# Patient Record
Sex: Female | Born: 1950 | Race: White | Hispanic: No | Smoking: Never smoker
Health system: Southern US, Community
[De-identification: ages and names within clinical notes are randomized; demographics above are authoritative.]

## PROBLEM LIST (undated history)

## (undated) DIAGNOSIS — J301 Allergic rhinitis due to pollen: Secondary | ICD-10-CM

## (undated) DIAGNOSIS — K219 Gastro-esophageal reflux disease without esophagitis: Secondary | ICD-10-CM

## (undated) DIAGNOSIS — J45909 Unspecified asthma, uncomplicated: Secondary | ICD-10-CM

## (undated) HISTORY — PX: OTHER SURGICAL HISTORY: SHX169

## (undated) HISTORY — DX: Allergic rhinitis due to pollen: J30.1

## (undated) HISTORY — DX: Unspecified asthma, uncomplicated: J45.909

## (undated) HISTORY — DX: Gastro-esophageal reflux disease without esophagitis: K21.9

## (undated) HISTORY — PX: BREAST EXCISIONAL BIOPSY: SUR124

---

## 2003-11-03 ENCOUNTER — Encounter: Admission: RE | Admit: 2003-11-03 | Discharge: 2003-11-03 | Payer: Self-pay | Admitting: Obstetrics and Gynecology

## 2004-05-01 ENCOUNTER — Encounter: Admission: RE | Admit: 2004-05-01 | Discharge: 2004-05-01 | Payer: Self-pay | Admitting: Obstetrics and Gynecology

## 2004-10-03 ENCOUNTER — Encounter: Admission: RE | Admit: 2004-10-03 | Discharge: 2004-10-03 | Payer: Self-pay | Admitting: Obstetrics and Gynecology

## 2005-10-23 ENCOUNTER — Encounter: Admission: RE | Admit: 2005-10-23 | Discharge: 2005-10-23 | Payer: Self-pay | Admitting: Obstetrics and Gynecology

## 2006-11-21 ENCOUNTER — Encounter: Admission: RE | Admit: 2006-11-21 | Discharge: 2006-11-21 | Payer: Self-pay | Admitting: Internal Medicine

## 2007-11-28 ENCOUNTER — Encounter: Admission: RE | Admit: 2007-11-28 | Discharge: 2007-11-28 | Payer: Self-pay | Admitting: Obstetrics and Gynecology

## 2008-12-07 ENCOUNTER — Encounter: Admission: RE | Admit: 2008-12-07 | Discharge: 2008-12-07 | Payer: Self-pay | Admitting: Internal Medicine

## 2009-12-28 ENCOUNTER — Encounter: Admission: RE | Admit: 2009-12-28 | Discharge: 2009-12-28 | Payer: Self-pay | Admitting: Obstetrics and Gynecology

## 2011-01-02 ENCOUNTER — Other Ambulatory Visit: Payer: Self-pay | Admitting: Obstetrics and Gynecology

## 2011-01-02 DIAGNOSIS — Z1231 Encounter for screening mammogram for malignant neoplasm of breast: Secondary | ICD-10-CM

## 2011-01-19 ENCOUNTER — Ambulatory Visit
Admission: RE | Admit: 2011-01-19 | Discharge: 2011-01-19 | Disposition: A | Discharge status: Home / Self Care | Payer: BC Managed Care – PPO | Source: Ambulatory Visit | Attending: Obstetrics and Gynecology | Admitting: Obstetrics and Gynecology

## 2011-01-19 DIAGNOSIS — Z1231 Encounter for screening mammogram for malignant neoplasm of breast: Secondary | ICD-10-CM

## 2012-01-16 ENCOUNTER — Other Ambulatory Visit: Payer: Self-pay | Admitting: Unknown Physician Specialty

## 2012-01-16 DIAGNOSIS — Z1231 Encounter for screening mammogram for malignant neoplasm of breast: Secondary | ICD-10-CM

## 2012-01-25 ENCOUNTER — Ambulatory Visit
Admission: RE | Admit: 2012-01-25 | Discharge: 2012-01-25 | Disposition: A | Discharge status: Home / Self Care | Payer: BC Managed Care – PPO | Source: Ambulatory Visit | Attending: Unknown Physician Specialty | Admitting: Unknown Physician Specialty

## 2012-01-25 DIAGNOSIS — Z1231 Encounter for screening mammogram for malignant neoplasm of breast: Secondary | ICD-10-CM

## 2013-02-03 ENCOUNTER — Other Ambulatory Visit: Payer: Self-pay

## 2013-02-03 DIAGNOSIS — Z1231 Encounter for screening mammogram for malignant neoplasm of breast: Secondary | ICD-10-CM

## 2013-02-27 ENCOUNTER — Ambulatory Visit
Admission: RE | Admit: 2013-02-27 | Discharge: 2013-02-27 | Disposition: A | Discharge status: Home / Self Care | Payer: BC Managed Care – PPO | Source: Ambulatory Visit

## 2013-02-27 DIAGNOSIS — Z1231 Encounter for screening mammogram for malignant neoplasm of breast: Secondary | ICD-10-CM

## 2013-03-03 ENCOUNTER — Other Ambulatory Visit: Payer: Self-pay | Admitting: Unknown Physician Specialty

## 2013-03-03 DIAGNOSIS — R928 Other abnormal and inconclusive findings on diagnostic imaging of breast: Secondary | ICD-10-CM

## 2013-03-17 ENCOUNTER — Ambulatory Visit
Admission: RE | Admit: 2013-03-17 | Discharge: 2013-03-17 | Disposition: A | Discharge status: Home / Self Care | Payer: BC Managed Care – PPO | Source: Ambulatory Visit | Attending: Unknown Physician Specialty | Admitting: Unknown Physician Specialty

## 2013-03-17 DIAGNOSIS — R928 Other abnormal and inconclusive findings on diagnostic imaging of breast: Secondary | ICD-10-CM

## 2014-02-15 ENCOUNTER — Other Ambulatory Visit: Payer: Self-pay

## 2014-02-15 DIAGNOSIS — Z1231 Encounter for screening mammogram for malignant neoplasm of breast: Secondary | ICD-10-CM

## 2014-03-05 ENCOUNTER — Ambulatory Visit
Admission: RE | Admit: 2014-03-05 | Discharge: 2014-03-05 | Disposition: A | Discharge status: Home / Self Care | Payer: BC Managed Care – PPO | Source: Ambulatory Visit

## 2014-03-05 DIAGNOSIS — Z1231 Encounter for screening mammogram for malignant neoplasm of breast: Secondary | ICD-10-CM

## 2015-02-04 ENCOUNTER — Other Ambulatory Visit: Payer: Self-pay

## 2015-02-04 DIAGNOSIS — Z1231 Encounter for screening mammogram for malignant neoplasm of breast: Secondary | ICD-10-CM

## 2015-03-11 ENCOUNTER — Ambulatory Visit
Admission: RE | Admit: 2015-03-11 | Discharge: 2015-03-11 | Disposition: A | Discharge status: Home / Self Care | Payer: Medicare Other | Source: Ambulatory Visit

## 2015-03-11 DIAGNOSIS — Z1231 Encounter for screening mammogram for malignant neoplasm of breast: Secondary | ICD-10-CM

## 2015-03-18 ENCOUNTER — Ambulatory Visit: Payer: Self-pay

## 2015-06-29 DIAGNOSIS — H25013 Cortical age-related cataract, bilateral: Secondary | ICD-10-CM | POA: Diagnosis not present

## 2015-06-29 DIAGNOSIS — H401131 Primary open-angle glaucoma, bilateral, mild stage: Secondary | ICD-10-CM | POA: Diagnosis not present

## 2015-07-04 DIAGNOSIS — Z1283 Encounter for screening for malignant neoplasm of skin: Secondary | ICD-10-CM | POA: Diagnosis not present

## 2015-07-04 DIAGNOSIS — L579 Skin changes due to chronic exposure to nonionizing radiation, unspecified: Secondary | ICD-10-CM | POA: Diagnosis not present

## 2015-07-04 DIAGNOSIS — L820 Inflamed seborrheic keratosis: Secondary | ICD-10-CM | POA: Diagnosis not present

## 2015-07-25 DIAGNOSIS — Z7189 Other specified counseling: Secondary | ICD-10-CM | POA: Diagnosis not present

## 2015-07-25 DIAGNOSIS — Z79899 Other long term (current) drug therapy: Secondary | ICD-10-CM | POA: Diagnosis not present

## 2015-07-25 DIAGNOSIS — Z0000 Encounter for general adult medical examination without abnormal findings: Secondary | ICD-10-CM | POA: Diagnosis not present

## 2015-07-25 DIAGNOSIS — Z1389 Encounter for screening for other disorder: Secondary | ICD-10-CM | POA: Diagnosis not present

## 2015-07-25 DIAGNOSIS — R5383 Other fatigue: Secondary | ICD-10-CM | POA: Diagnosis not present

## 2015-08-18 DIAGNOSIS — M810 Age-related osteoporosis without current pathological fracture: Secondary | ICD-10-CM | POA: Diagnosis not present

## 2015-09-02 DIAGNOSIS — N390 Urinary tract infection, site not specified: Secondary | ICD-10-CM | POA: Diagnosis not present

## 2015-10-14 DIAGNOSIS — K5900 Constipation, unspecified: Secondary | ICD-10-CM | POA: Diagnosis not present

## 2015-10-14 DIAGNOSIS — Z299 Encounter for prophylactic measures, unspecified: Secondary | ICD-10-CM | POA: Diagnosis not present

## 2015-10-18 ENCOUNTER — Encounter (INDEPENDENT_AMBULATORY_CARE_PROVIDER_SITE_OTHER): Payer: Self-pay | Admitting: *Deleted

## 2015-11-09 ENCOUNTER — Encounter (INDEPENDENT_AMBULATORY_CARE_PROVIDER_SITE_OTHER): Payer: Self-pay

## 2015-11-09 ENCOUNTER — Other Ambulatory Visit (INDEPENDENT_AMBULATORY_CARE_PROVIDER_SITE_OTHER): Payer: Self-pay | Admitting: *Deleted

## 2015-11-09 ENCOUNTER — Encounter (INDEPENDENT_AMBULATORY_CARE_PROVIDER_SITE_OTHER): Payer: Self-pay | Admitting: *Deleted

## 2015-11-09 ENCOUNTER — Ambulatory Visit (INDEPENDENT_AMBULATORY_CARE_PROVIDER_SITE_OTHER): Payer: Medicare Other | Admitting: Internal Medicine

## 2015-11-09 ENCOUNTER — Encounter (INDEPENDENT_AMBULATORY_CARE_PROVIDER_SITE_OTHER): Payer: Self-pay | Admitting: Internal Medicine

## 2015-11-09 ENCOUNTER — Other Ambulatory Visit (INDEPENDENT_AMBULATORY_CARE_PROVIDER_SITE_OTHER): Payer: Self-pay | Admitting: Internal Medicine

## 2015-11-09 VITALS — BP 110/68 | HR 66 | Temp 97.7°F | Ht 67.0 in | Wt 164.5 lb

## 2015-11-09 DIAGNOSIS — K5909 Other constipation: Secondary | ICD-10-CM

## 2015-11-09 DIAGNOSIS — Z1211 Encounter for screening for malignant neoplasm of colon: Secondary | ICD-10-CM

## 2015-11-09 DIAGNOSIS — J45909 Unspecified asthma, uncomplicated: Secondary | ICD-10-CM | POA: Insufficient documentation

## 2015-11-09 MED ORDER — PEG 3350-KCL-NA BICARB-NACL 420 G PO SOLR: 4000.0000 mL | Freq: Once | ORAL | Status: DC

## 2015-11-09 NOTE — Telephone Encounter (Signed)
Patient needs trilyte 

## 2015-11-09 NOTE — Progress Notes (Signed)
   Subjective:    Patient ID: Mackenzie Craig, female    DOB: May 10, 1951, 65 y.o.   MRN: [id number]  HPI Referred by Dr. Woody Seller for  colonoscopy.  She tells me she is doing okay. Her appetite is good. She eats lots of vegetables and water. No dysphagia. She says sometimes her stools are thin. She has not seen any blood. She usually has a BM or every 3 days. No abdominal pain.  She denies any acid reflux unless she eats spicy foods.  No change in her stool.  Last colonoscopy was in 2005 by Dr. Lindalou Hose. She tells me there were no polyps. Some diverticulosis in her ascending colon.  Colonoscope passed all the way to the cecum. Internal Hemorrhoids.   07/26/2015 H and H 13.1 and 40.0, MCV 89.  Review of Systems Past Medical History  Diagnosis Date  . Asthma     Past Surgical History  Procedure Laterality Date  . Breast biopsies      x 2 and benign    No Known Allergies  No current outpatient prescriptions on file prior to visit.   No current facility-administered medications on file prior to visit.   Current Outpatient Prescriptions  Medication Sig Dispense Refill  . Biotin 1000 MCG tablet Take 1,000 mcg by mouth 3 (three) times daily.    . fexofenadine (ALLEGRA) 180 MG tablet Take 180 mg by mouth daily.     No current facility-administered medications for this visit.         Objective:   Physical ExamBlood pressure 110/68, pulse 66, temperature 97.7 F (36.5 C), height 5\' 7"  (1.702 m), weight 164 lb 8 oz (74.617 kg). Alert and oriented. Skin warm and dry. Oral mucosa is moist.   . Sclera anicteric, conjunctivae is pink. Thyroid not enlarged. No cervical lymphadenopathy. Lungs clear. Heart regular rate and rhythm.  Abdomen is soft. Bowel sounds are positive. No hepatomegaly. No abdominal masses felt. No tenderness.  No edema to lower extremities.          Assessment & Plan:  Constipation: Benefiber daily.  Screening colonoscopy. The risks and benefits such as perforation,  bleeding, and infection were reviewed with the patient and is agreeable.

## 2015-11-09 NOTE — Patient Instructions (Addendum)
The risks and benefits such as perforation, bleeding, and infection were reviewed with the patient and is agreeable. Benefiber for constipation.

## 2015-11-22 DIAGNOSIS — R0602 Shortness of breath: Secondary | ICD-10-CM | POA: Diagnosis not present

## 2015-11-28 DIAGNOSIS — J45909 Unspecified asthma, uncomplicated: Secondary | ICD-10-CM | POA: Diagnosis not present

## 2015-11-28 DIAGNOSIS — J301 Allergic rhinitis due to pollen: Secondary | ICD-10-CM | POA: Diagnosis not present

## 2016-01-11 ENCOUNTER — Ambulatory Visit (HOSPITAL_COMMUNITY)
Admission: RE | Admit: 2016-01-11 | Discharge: 2016-01-11 | Disposition: A | Discharge status: Home / Self Care | Payer: Medicare Other | Source: Ambulatory Visit | Attending: Internal Medicine | Admitting: Internal Medicine

## 2016-01-11 ENCOUNTER — Encounter (HOSPITAL_COMMUNITY): Payer: Self-pay | Admitting: *Deleted

## 2016-01-11 ENCOUNTER — Encounter (HOSPITAL_COMMUNITY)
Admission: RE | Disposition: A | Discharge status: Home / Self Care | Payer: Self-pay | Source: Ambulatory Visit | Attending: Internal Medicine

## 2016-01-11 DIAGNOSIS — D123 Benign neoplasm of transverse colon: Secondary | ICD-10-CM | POA: Diagnosis not present

## 2016-01-11 DIAGNOSIS — K5730 Diverticulosis of large intestine without perforation or abscess without bleeding: Secondary | ICD-10-CM | POA: Diagnosis not present

## 2016-01-11 DIAGNOSIS — K5909 Other constipation: Secondary | ICD-10-CM

## 2016-01-11 DIAGNOSIS — K644 Residual hemorrhoidal skin tags: Secondary | ICD-10-CM | POA: Diagnosis not present

## 2016-01-11 DIAGNOSIS — Z79899 Other long term (current) drug therapy: Secondary | ICD-10-CM | POA: Insufficient documentation

## 2016-01-11 DIAGNOSIS — J45909 Unspecified asthma, uncomplicated: Secondary | ICD-10-CM | POA: Diagnosis not present

## 2016-01-11 DIAGNOSIS — Z1211 Encounter for screening for malignant neoplasm of colon: Secondary | ICD-10-CM | POA: Diagnosis not present

## 2016-01-11 HISTORY — PX: COLONOSCOPY: SHX5424

## 2016-01-11 SURGERY — COLONOSCOPY
Anesthesia: Moderate Sedation

## 2016-01-11 MED ORDER — MEPERIDINE HCL 50 MG/ML IJ SOLN
INTRAMUSCULAR | Status: DC
Start: 2016-01-11 — End: 2016-01-11
  Filled 2016-01-11: qty 1

## 2016-01-11 MED ORDER — MIDAZOLAM HCL 5 MG/5ML IJ SOLN
INTRAMUSCULAR | Status: AC
  Filled 2016-01-11: qty 10

## 2016-01-11 MED ORDER — SODIUM CHLORIDE 0.9 % IV SOLN
INTRAVENOUS | Status: DC
Start: 2016-01-11 — End: 2016-01-11
  Administered 2016-01-11: 11:00:00 via INTRAVENOUS

## 2016-01-11 MED ORDER — MIDAZOLAM HCL 5 MG/5ML IJ SOLN
INTRAMUSCULAR | Status: DC | PRN
  Administered 2016-01-11 (×3): 2 mg via INTRAVENOUS

## 2016-01-11 MED ORDER — STERILE WATER FOR IRRIGATION IR SOLN
Status: DC | PRN
  Administered 2016-01-11: 12:00:00

## 2016-01-11 MED ORDER — MEPERIDINE HCL 50 MG/ML IJ SOLN
INTRAMUSCULAR | Status: DC | PRN
Start: 2016-01-11 — End: 2016-01-11
  Administered 2016-01-11 (×2): 25 mg

## 2016-01-11 NOTE — Op Note (Signed)
[facility] Patient Name: Mackenzie Craig Procedure Date: 01/11/2016 11:23 AM MRN: [id number] Date of Birth: 05/21/1951 Attending MD: Hildred Laser , MD CSN: [id number] Age: 65 Admit Type: Outpatient Procedure:                Colonoscopy Indications:              Screening for colorectal malignant neoplasm Providers:                Hildred Laser, MD, Janeece Riggers, RN, Randa Spike,                            Technician Referring MD:             Glenda Chroman, MD Medicines:                Meperidine 50 mg IV, Midazolam 6 mg IV Complications:            No immediate complications. Estimated Blood Loss:     Estimated blood loss was minimal. Procedure:                Pre-Anesthesia Assessment:                           - Prior to the procedure, a History and Physical                            was performed, and patient medications and                            allergies were reviewed. The patient's tolerance of                            previous anesthesia was also reviewed. The risks                            and benefits of the procedure and the sedation                            options and risks were discussed with the patient.                            All questions were answered, and informed consent                            was obtained. Prior Anticoagulants: The patient has                            taken no previous anticoagulant or antiplatelet                            agents. ASA Grade Assessment: I - A normal, healthy                            patient. After reviewing the risks and benefits,  the patient was deemed in satisfactory condition to                            undergo the procedure.                           After obtaining informed consent, the colonoscope                            was passed under direct vision. Throughout the                            procedure, the patient's blood pressure, pulse, and         oxygen saturations were monitored continuously. The                            [id number]) scope was introduced through                            the anus and advanced to the the cecum, identified                            by appendiceal orifice and ileocecal valve. The                            colonoscopy was performed without difficulty. The                            patient tolerated the procedure well. The quality                            of the bowel preparation was good. The ileocecal                            valve, appendiceal orifice, and rectum were                            photographed. Scope In: 11:41:06 AM Scope Out: 12:00:53 PM Scope Withdrawal Time: 0 hours 11 minutes 55 seconds  Total Procedure Duration: 0 hours 19 minutes 47 seconds  Findings:      A 5 mm polyp was found in the transverse colon. The polyp was sessile.       The polyp was removed with a cold snare. Resection and retrieval were       complete.      A few small-mouthed diverticula were found in the sigmoid colon.      External hemorrhoids were found during retroflexion. The hemorrhoids       were small. Impression:               - One 5 mm polyp in the transverse colon, removed                            with a cold snare. Resected and retrieved.                           -  Diverticulosis in the sigmoid colon.                           - External hemorrhoids. Moderate Sedation:      Moderate (conscious) sedation was administered by the endoscopy nurse       and supervised by the endoscopist. The following parameters were       monitored: oxygen saturation, heart rate, blood pressure, CO2       capnography and response to care. Total physician intraservice time was       26 minutes. Recommendation:           - Patient has a contact number available for                            emergencies. The signs and symptoms of potential                            delayed complications were  discussed with the                            patient. Return to normal activities tomorrow.                            Written discharge instructions were provided to the                            patient.                           - High fiber diet today.                           - Continue present medications.                           - Await pathology results.                           - Repeat colonoscopy for surveillance based on                            pathology results. Procedure Code(s):        --- Professional ---                           [phone], Colonoscopy, flexible; with removal of                            tumor(s), polyp(s), or other lesion(s) by snare                            technique                           99152, Moderate sedation services provided by the  same physician or other qualified health care                            professional performing the diagnostic or                            therapeutic service that the sedation supports,                            requiring the presence of an independent trained                            observer to assist in the monitoring of the                            patient's level of consciousness and physiological                            status; initial 15 minutes of intraservice time,                            patient age 12 years or older                           [phone], Moderate sedation services; each additional                            15 minutes intraservice time Diagnosis Code(s):        --- Professional ---                           Z12.11, Encounter for screening for malignant                            neoplasm of colon                           D12.3, Benign neoplasm of transverse colon (hepatic                            flexure or splenic flexure)                           K64.4, Residual hemorrhoidal skin tags                           K57.30, Diverticulosis of  large intestine without                            perforation or abscess without bleeding CPT copyright 2016 American [employer] Association. All rights reserved. The codes documented in this report are preliminary and upon coder review may  be revised to meet current compliance requirements. Hildred Laser, MD Hildred Laser, MD 01/11/2016 12:07:16 PM This report has been signed electronically. Number of Addenda: 0

## 2016-01-11 NOTE — H&P (Signed)
Mackenzie Craig is an 65 y.o. female.   Chief Complaint: Patient is here for colonoscopy. HPI: Patient is 65 year old Caucasian female who is here for screening colonoscopy. She denies abdominal pain change in bowel habits or rectal bleeding. Last exam was in 2005. Family history is negative for CRC.  Past Medical History:  Diagnosis Date  . Asthma     Past Surgical History:  Procedure Laterality Date  . breast biopsies     x 2 and benign    History reviewed. No pertinent family history. Social History:  reports that she has never smoked. She has never used smokeless tobacco. She reports that she does not drink alcohol or use drugs.  Allergies: No Known Allergies  Medications Prior to Admission  Medication Sig Dispense Refill  . Biotin 1000 MCG tablet Take 1,000 mcg by mouth 3 (three) times daily.    . fexofenadine (ALLEGRA) 180 MG tablet Take 180 mg by mouth daily.    . Multiple Vitamin (MULTIVITAMIN WITH MINERALS) TABS tablet Take 1 tablet by mouth daily.    . polyethylene glycol-electrolytes (NULYTELY/GOLYTELY) 420 g solution Take 4,000 mLs by mouth once. 4000 mL 0  . PROAIR HFA 108 (90 Base) MCG/ACT inhaler Inhale 2 puffs into the lungs every 4 (four) hours as needed for wheezing or shortness of breath.   3  . tretinoin (RETIN-A) 0.1 % cream Apply 1 application topically at bedtime as needed (ACNE).   99    No results found for this or any previous visit (from the past 48 hour(s)). No results found.  ROS  Blood pressure 134/72, pulse 69, temperature 98.4 F (36.9 C), temperature source Oral, resp. rate 14, height 5\' 7"  (1.702 m), weight 158 lb (71.7 kg), SpO2 100 %. Physical Exam  Constitutional: She appears well-developed and well-nourished.  HENT:  Mouth/Throat: Oropharynx is clear and moist.  Eyes: Conjunctivae are normal. No scleral icterus.  Neck: No thyromegaly present.  Cardiovascular: Normal rate, regular rhythm and normal heart sounds.   No murmur  heard. Respiratory: Effort normal and breath sounds normal.  GI: Soft. She exhibits no distension and no mass. There is no tenderness.  Musculoskeletal: She exhibits no edema.  Lymphadenopathy:    She has no cervical adenopathy.  Neurological: She is alert.  Skin: Skin is warm.     Assessment/Plan Average risk screening colonoscopy.  Hildred Laser, MD 01/11/2016, 11:31 AM

## 2016-01-11 NOTE — Discharge Instructions (Signed)
Resume usual medications and high fiber diet. No driving for 24 hours. Physician will call with biopsy results.  Colonoscopy, Care After Refer to this sheet in the next few weeks. These instructions provide you with information on caring for yourself after your procedure. Your health care provider may also give you more specific instructions. Your treatment has been planned according to current medical practices, but problems sometimes occur. Call your health care provider if you have any problems or questions after your procedure. WHAT TO EXPECT AFTER THE PROCEDURE  After your procedure, it is typical to have the following:  A small amount of blood in your stool.  Moderate amounts of gas and mild abdominal cramping or bloating. HOME CARE INSTRUCTIONS  Do not drive, operate machinery, or sign important documents for 24 hours.  You may shower and resume your regular physical activities, but move at a slower pace for the first 24 hours.  Take frequent rest periods for the first 24 hours.  Walk around or put a warm pack on your abdomen to help reduce abdominal cramping and bloating.  Drink enough fluids to keep your urine clear or pale yellow.  You may resume your normal diet as instructed by your health care provider. Avoid heavy or fried foods that are hard to digest.  Avoid drinking alcohol for 24 hours or as instructed by your health care provider.  Only take over-the-counter or prescription medicines as directed by your health care provider.  If a tissue sample (biopsy) was taken during your procedure:  Do not take aspirin or blood thinners for 7 days, or as instructed by your health care provider.  Do not drink alcohol for 7 days, or as instructed by your health care provider.  Eat soft foods for the first 24 hours. SEEK MEDICAL CARE IF: You have persistent spotting of blood in your stool 2-3 days after the procedure. SEEK IMMEDIATE MEDICAL CARE IF:  You have more than a  small spotting of blood in your stool.  You pass large blood clots in your stool.  Your abdomen is swollen (distended).  You have nausea or vomiting.  You have a fever.  You have increasing abdominal pain that is not relieved with medicine.   This information is not intended to replace advice given to you by your health care provider. Make sure you discuss any questions you have with your health care provider.   Colon Polyps Polyps are lumps of extra tissue growing inside the body. Polyps can grow in the large intestine (colon). Most colon polyps are noncancerous (benign). However, some colon polyps can become cancerous over time. Polyps that are larger than a pea may be harmful. To be safe, caregivers remove and test all polyps. CAUSES  Polyps form when mutations in the genes cause your cells to grow and divide even though no more tissue is needed. RISK FACTORS There are a number of risk factors that can increase your chances of getting colon polyps. They include:  Being older than 50 years.  Family history of colon polyps or colon cancer.  Long-term colon diseases, such as colitis or Crohn disease.  Being overweight.  Smoking.  Being inactive.  Drinking too much alcohol. SYMPTOMS  Most small polyps do not cause symptoms. If symptoms are present, they may include:  Blood in the stool. The stool may look dark red or black.  Constipation or diarrhea that lasts longer than 1 week. DIAGNOSIS People often do not know they have polyps until their caregiver  finds them during a regular checkup. Your caregiver can use 4 tests to check for polyps:  Digital rectal exam. The caregiver wears gloves and feels inside the rectum. This test would find polyps only in the rectum.  Barium enema. The caregiver puts a liquid called barium into your rectum before taking X-rays of your colon. Barium makes your colon look white. Polyps are dark, so they are easy to see in the X-ray  pictures.  Sigmoidoscopy. A thin, flexible tube (sigmoidoscope) is placed into your rectum. The sigmoidoscope has a light and tiny camera in it. The caregiver uses the sigmoidoscope to look at the last third of your colon.  Colonoscopy. This test is like sigmoidoscopy, but the caregiver looks at the entire colon. This is the most common method for finding and removing polyps. TREATMENT  Any polyps will be removed during a sigmoidoscopy or colonoscopy. The polyps are then tested for cancer. PREVENTION  To help lower your risk of getting more colon polyps:  Eat plenty of fruits and vegetables. Avoid eating fatty foods.  Do not smoke.  Avoid drinking alcohol.  Exercise every day.  Lose weight if recommended by your caregiver.  Eat plenty of calcium and folate. Foods that are rich in calcium include milk, cheese, and broccoli. Foods that are rich in folate include chickpeas, kidney beans, and spinach. HOME CARE INSTRUCTIONS Keep all follow-up appointments as directed by your caregiver. You may need periodic exams to check for polyps. SEEK MEDICAL CARE IF: You notice bleeding during a bowel movement.   This information is not intended to replace advice given to you by your health care provider. Make sure you discuss any questions you have with your health care provider.    Diverticulosis Diverticulosis is the condition that develops when small pouches (diverticula) form in the wall of your colon. Your colon, or large intestine, is where water is absorbed and stool is formed. The pouches form when the inside layer of your colon pushes through weak spots in the outer layers of your colon. CAUSES  No one knows exactly what causes diverticulosis. RISK FACTORS  Being older than 40. Your risk for this condition increases with age. Diverticulosis is rare in people younger than 40 years. By age 88, almost everyone has it.  Eating a low-fiber diet.  Being frequently constipated.  Being  overweight.  Not getting enough exercise.  Smoking.  Taking over-the-counter pain medicines, like aspirin and ibuprofen. SYMPTOMS  Most people with diverticulosis do not have symptoms. DIAGNOSIS  Because diverticulosis often has no symptoms, health care providers often discover the condition during an exam for other colon problems. In many cases, a health care provider will diagnose diverticulosis while using a flexible scope to examine the colon (colonoscopy). TREATMENT  If you have never developed an infection related to diverticulosis, you may not need treatment. If you have had an infection before, treatment may include:  Eating more fruits, vegetables, and grains.  Taking a fiber supplement.  Taking a live bacteria supplement (probiotic).  Taking medicine to relax your colon. HOME CARE INSTRUCTIONS   Drink at least 6-8 glasses of water each day to prevent constipation.  Try not to strain when you have a bowel movement.  Keep all follow-up appointments. If you have had an infection before:  Increase the fiber in your diet as directed by your health care provider or dietitian.  Take a dietary fiber supplement if your health care provider approves.  Only take medicines as directed by your  health care provider. SEEK MEDICAL CARE IF:   You have abdominal pain.  You have bloating.  You have cramps.  You have not gone to the bathroom in 3 days. SEEK IMMEDIATE MEDICAL CARE IF:   Your pain gets worse.  Yourbloating becomes very bad.  You have a fever or chills, and your symptoms suddenly get worse.  You begin vomiting.  You have bowel movements that are bloody or black. MAKE SURE YOU:  Understand these instructions.  Will watch your condition.  Will get help right away if you are not doing well or get worse.   This information is not intended to replace advice given to you by your health care provider. Make sure you discuss any questions you have with your  health care provider.

## 2016-01-13 ENCOUNTER — Encounter (HOSPITAL_COMMUNITY): Payer: Self-pay | Admitting: Internal Medicine

## 2016-01-16 DIAGNOSIS — H401131 Primary open-angle glaucoma, bilateral, mild stage: Secondary | ICD-10-CM | POA: Diagnosis not present

## 2016-01-23 DIAGNOSIS — H401131 Primary open-angle glaucoma, bilateral, mild stage: Secondary | ICD-10-CM | POA: Diagnosis not present

## 2016-01-25 DIAGNOSIS — H401131 Primary open-angle glaucoma, bilateral, mild stage: Secondary | ICD-10-CM | POA: Diagnosis not present

## 2016-01-25 DIAGNOSIS — H2513 Age-related nuclear cataract, bilateral: Secondary | ICD-10-CM | POA: Diagnosis not present

## 2016-02-27 DIAGNOSIS — H401131 Primary open-angle glaucoma, bilateral, mild stage: Secondary | ICD-10-CM | POA: Diagnosis not present

## 2016-03-12 ENCOUNTER — Encounter (HOSPITAL_COMMUNITY)
Admission: RE | Disposition: A | Discharge status: Home / Self Care | Payer: Self-pay | Source: Ambulatory Visit | Attending: Ophthalmology

## 2016-03-12 ENCOUNTER — Ambulatory Visit (HOSPITAL_COMMUNITY)
Admission: RE | Admit: 2016-03-12 | Discharge: 2016-03-12 | Disposition: A | Discharge status: Home / Self Care | Payer: Medicare Other | Source: Ambulatory Visit | Attending: Ophthalmology | Admitting: Ophthalmology

## 2016-03-12 ENCOUNTER — Encounter (HOSPITAL_COMMUNITY): Payer: Self-pay | Admitting: *Deleted

## 2016-03-12 DIAGNOSIS — H409 Unspecified glaucoma: Secondary | ICD-10-CM | POA: Diagnosis not present

## 2016-03-12 DIAGNOSIS — H401131 Primary open-angle glaucoma, bilateral, mild stage: Secondary | ICD-10-CM | POA: Diagnosis not present

## 2016-03-12 HISTORY — PX: SLT LASER APPLICATION: SHX6099

## 2016-03-12 SURGERY — SLT LASER APPLICATION
Anesthesia: LOCAL | Laterality: Left

## 2016-03-12 MED ORDER — APRACLONIDINE HCL 1 % OP SOLN
1.0000 [drp] | OPHTHALMIC | Status: AC
  Administered 2016-03-12 (×3): 1 [drp] via OPHTHALMIC

## 2016-03-12 MED ORDER — PILOCARPINE HCL 1 % OP SOLN
2.0000 [drp] | Freq: Once | OPHTHALMIC | Status: AC
  Administered 2016-03-12: 2 [drp] via OPHTHALMIC

## 2016-03-12 MED ORDER — TETRACAINE HCL 0.5 % OP SOLN
OPHTHALMIC | Status: AC
  Filled 2016-03-12: qty 4

## 2016-03-12 MED ORDER — APRACLONIDINE HCL 1 % OP SOLN
OPHTHALMIC | Status: AC
  Filled 2016-03-12: qty 0.1

## 2016-03-12 MED ORDER — TETRACAINE HCL 0.5 % OP SOLN
1.0000 [drp] | Freq: Once | OPHTHALMIC | Status: AC
  Administered 2016-03-12: 1 [drp] via OPHTHALMIC

## 2016-03-12 MED ORDER — PILOCARPINE HCL 1 % OP SOLN
OPHTHALMIC | Status: AC
  Filled 2016-03-12: qty 15

## 2016-03-12 NOTE — Discharge Instructions (Signed)
QUINIYAH FRITZSCHE  03/12/2016     Instructions    Activity: No Restrictions.   Diet: Resume Diet you were on at home.   Pain Medication: Tylenol if Needed.   CONTACT YOUR DOCTOR IF YOU HAVE PAIN, REDNESS IN YOUR EYE, OR DECREASED VISION.   Follow-up: 04/03/16 at 11:15am with Williams Che, MD.     Dr. Iona Hansen: [phone]     If you find that you cannot contact your physician, but feel that your signs and   Symptoms warrant a physician's attention, call the Emergency Room at   [phone] [phone].

## 2016-03-12 NOTE — H&P (Signed)
I have reviewed the pre printed H&P, the patient was re-examined, and I have identified no significant interval changes in the patient's medical condition.  There is no change in the plan of care since the history and physical of record. 

## 2016-03-12 NOTE — Brief Op Note (Signed)
MARCILLA STABER 03/12/2016  Williams Che, MD  Pre-op Diagnosis:  uncontrolled glaucoma  OS  Post-op Diagnosis: uncontrolled glaucoma  OS  Yag laser self-test completed: Yes.    Indications:  See scanned office H&P for details  Procedure: SLT laser  left eye  Eye protection worn by staff:  Yes.   Laser In Use sign on door:  Yes.    Laser:  {LUMENIS YAG/SLT LASER  Power Setting:  0.7-0.9 mJ/burst Anatomical site treated:  Trabecular meshwork OS Number of applications:  0000000 Total energy delivered: 100.81 mJ Results:  Treated to bubble formation  The patient was discharged home with instructions to continue all her current glaucoma medications in the un-operated eye, and discontinue all her current glaucoma medications, if any.  Patient was instructed to go to the office, as previously scheduled, for intraocular pressure:  Yes.    Patient verbalizes understanding of discharge instructions:  Yes.    Notes:  Gomoscopy:  Grade:   4 Open    Pigmentation:  Heavy pigmentation    Synechiae:  none    Angle ressessions : none    No complications, pt tolerated procedure well

## 2016-03-16 DIAGNOSIS — M8580 Other specified disorders of bone density and structure, unspecified site: Secondary | ICD-10-CM | POA: Diagnosis not present

## 2016-03-16 DIAGNOSIS — Z299 Encounter for prophylactic measures, unspecified: Secondary | ICD-10-CM | POA: Diagnosis not present

## 2016-03-16 DIAGNOSIS — J45909 Unspecified asthma, uncomplicated: Secondary | ICD-10-CM | POA: Diagnosis not present

## 2016-03-16 DIAGNOSIS — R42 Dizziness and giddiness: Secondary | ICD-10-CM | POA: Diagnosis not present

## 2016-03-19 ENCOUNTER — Encounter (HOSPITAL_COMMUNITY): Payer: Self-pay | Admitting: Ophthalmology

## 2016-04-05 ENCOUNTER — Other Ambulatory Visit: Payer: Self-pay | Admitting: Unknown Physician Specialty

## 2016-04-05 DIAGNOSIS — Z1231 Encounter for screening mammogram for malignant neoplasm of breast: Secondary | ICD-10-CM

## 2016-04-06 DIAGNOSIS — Z23 Encounter for immunization: Secondary | ICD-10-CM | POA: Diagnosis not present

## 2016-05-01 ENCOUNTER — Ambulatory Visit
Admission: RE | Admit: 2016-05-01 | Discharge: 2016-05-01 | Disposition: A | Discharge status: Home / Self Care | Payer: Medicare Other | Source: Ambulatory Visit | Attending: Unknown Physician Specialty | Admitting: Unknown Physician Specialty

## 2016-05-01 DIAGNOSIS — Z1231 Encounter for screening mammogram for malignant neoplasm of breast: Secondary | ICD-10-CM

## 2016-05-14 DIAGNOSIS — H401132 Primary open-angle glaucoma, bilateral, moderate stage: Secondary | ICD-10-CM | POA: Diagnosis not present

## 2016-05-14 DIAGNOSIS — H43812 Vitreous degeneration, left eye: Secondary | ICD-10-CM | POA: Diagnosis not present

## 2016-05-14 DIAGNOSIS — H2513 Age-related nuclear cataract, bilateral: Secondary | ICD-10-CM | POA: Diagnosis not present

## 2016-05-30 DIAGNOSIS — Z23 Encounter for immunization: Secondary | ICD-10-CM | POA: Diagnosis not present

## 2016-06-20 DIAGNOSIS — H2513 Age-related nuclear cataract, bilateral: Secondary | ICD-10-CM | POA: Diagnosis not present

## 2016-06-20 DIAGNOSIS — H401132 Primary open-angle glaucoma, bilateral, moderate stage: Secondary | ICD-10-CM | POA: Diagnosis not present

## 2016-06-20 DIAGNOSIS — H401122 Primary open-angle glaucoma, left eye, moderate stage: Secondary | ICD-10-CM | POA: Diagnosis not present

## 2016-06-20 DIAGNOSIS — H43812 Vitreous degeneration, left eye: Secondary | ICD-10-CM | POA: Diagnosis not present

## 2016-07-19 DIAGNOSIS — Z1283 Encounter for screening for malignant neoplasm of skin: Secondary | ICD-10-CM | POA: Diagnosis not present

## 2016-07-19 DIAGNOSIS — L908 Other atrophic disorders of skin: Secondary | ICD-10-CM | POA: Diagnosis not present

## 2016-07-19 DIAGNOSIS — D225 Melanocytic nevi of trunk: Secondary | ICD-10-CM | POA: Diagnosis not present

## 2016-07-30 DIAGNOSIS — Z1389 Encounter for screening for other disorder: Secondary | ICD-10-CM | POA: Diagnosis not present

## 2016-07-30 DIAGNOSIS — Z0000 Encounter for general adult medical examination without abnormal findings: Secondary | ICD-10-CM | POA: Diagnosis not present

## 2016-07-30 DIAGNOSIS — Z1211 Encounter for screening for malignant neoplasm of colon: Secondary | ICD-10-CM | POA: Diagnosis not present

## 2016-07-30 DIAGNOSIS — Z79899 Other long term (current) drug therapy: Secondary | ICD-10-CM | POA: Diagnosis not present

## 2016-07-30 DIAGNOSIS — E7800 Pure hypercholesterolemia, unspecified: Secondary | ICD-10-CM | POA: Diagnosis not present

## 2016-07-30 DIAGNOSIS — Z7189 Other specified counseling: Secondary | ICD-10-CM | POA: Diagnosis not present

## 2016-07-30 DIAGNOSIS — E663 Overweight: Secondary | ICD-10-CM | POA: Diagnosis not present

## 2016-07-30 DIAGNOSIS — Z299 Encounter for prophylactic measures, unspecified: Secondary | ICD-10-CM | POA: Diagnosis not present

## 2016-07-30 DIAGNOSIS — J45909 Unspecified asthma, uncomplicated: Secondary | ICD-10-CM | POA: Diagnosis not present

## 2016-07-30 DIAGNOSIS — R5383 Other fatigue: Secondary | ICD-10-CM | POA: Diagnosis not present

## 2016-10-22 DIAGNOSIS — H401132 Primary open-angle glaucoma, bilateral, moderate stage: Secondary | ICD-10-CM | POA: Diagnosis not present

## 2016-10-22 DIAGNOSIS — H2513 Age-related nuclear cataract, bilateral: Secondary | ICD-10-CM | POA: Diagnosis not present

## 2016-10-22 DIAGNOSIS — H401122 Primary open-angle glaucoma, left eye, moderate stage: Secondary | ICD-10-CM | POA: Diagnosis not present

## 2016-10-22 DIAGNOSIS — H43812 Vitreous degeneration, left eye: Secondary | ICD-10-CM | POA: Diagnosis not present

## 2016-11-19 DIAGNOSIS — J45909 Unspecified asthma, uncomplicated: Secondary | ICD-10-CM | POA: Diagnosis not present

## 2016-11-19 DIAGNOSIS — J301 Allergic rhinitis due to pollen: Secondary | ICD-10-CM | POA: Diagnosis not present

## 2017-01-23 DIAGNOSIS — Z299 Encounter for prophylactic measures, unspecified: Secondary | ICD-10-CM | POA: Diagnosis not present

## 2017-01-23 DIAGNOSIS — R21 Rash and other nonspecific skin eruption: Secondary | ICD-10-CM | POA: Diagnosis not present

## 2017-01-23 DIAGNOSIS — J45909 Unspecified asthma, uncomplicated: Secondary | ICD-10-CM | POA: Diagnosis not present

## 2017-01-23 DIAGNOSIS — F419 Anxiety disorder, unspecified: Secondary | ICD-10-CM | POA: Diagnosis not present

## 2017-01-23 DIAGNOSIS — Z789 Other specified health status: Secondary | ICD-10-CM | POA: Diagnosis not present

## 2017-01-23 DIAGNOSIS — Z6829 Body mass index (BMI) 29.0-29.9, adult: Secondary | ICD-10-CM | POA: Diagnosis not present

## 2017-01-23 DIAGNOSIS — E7800 Pure hypercholesterolemia, unspecified: Secondary | ICD-10-CM | POA: Diagnosis not present

## 2017-02-25 DIAGNOSIS — H2513 Age-related nuclear cataract, bilateral: Secondary | ICD-10-CM | POA: Diagnosis not present

## 2017-02-25 DIAGNOSIS — H401132 Primary open-angle glaucoma, bilateral, moderate stage: Secondary | ICD-10-CM | POA: Diagnosis not present

## 2017-02-25 DIAGNOSIS — H43813 Vitreous degeneration, bilateral: Secondary | ICD-10-CM | POA: Diagnosis not present

## 2017-04-01 DIAGNOSIS — Z23 Encounter for immunization: Secondary | ICD-10-CM | POA: Diagnosis not present

## 2017-04-17 ENCOUNTER — Other Ambulatory Visit: Payer: Self-pay | Admitting: Internal Medicine

## 2017-04-17 DIAGNOSIS — Z1231 Encounter for screening mammogram for malignant neoplasm of breast: Secondary | ICD-10-CM

## 2017-05-16 ENCOUNTER — Ambulatory Visit
Admission: RE | Admit: 2017-05-16 | Discharge: 2017-05-16 | Disposition: A | Discharge status: Home / Self Care | Payer: Medicare Other | Source: Ambulatory Visit | Attending: Internal Medicine | Admitting: Internal Medicine

## 2017-05-16 DIAGNOSIS — Z1231 Encounter for screening mammogram for malignant neoplasm of breast: Secondary | ICD-10-CM

## 2017-07-03 DIAGNOSIS — H401132 Primary open-angle glaucoma, bilateral, moderate stage: Secondary | ICD-10-CM | POA: Diagnosis not present

## 2017-07-03 DIAGNOSIS — H2513 Age-related nuclear cataract, bilateral: Secondary | ICD-10-CM | POA: Diagnosis not present

## 2017-07-03 DIAGNOSIS — H43813 Vitreous degeneration, bilateral: Secondary | ICD-10-CM | POA: Diagnosis not present

## 2017-07-31 DIAGNOSIS — J4521 Mild intermittent asthma with (acute) exacerbation: Secondary | ICD-10-CM | POA: Diagnosis not present

## 2017-07-31 DIAGNOSIS — R05 Cough: Secondary | ICD-10-CM | POA: Diagnosis not present

## 2017-08-05 DIAGNOSIS — M8580 Other specified disorders of bone density and structure, unspecified site: Secondary | ICD-10-CM | POA: Diagnosis not present

## 2017-08-05 DIAGNOSIS — Z6823 Body mass index (BMI) 23.0-23.9, adult: Secondary | ICD-10-CM | POA: Diagnosis not present

## 2017-08-05 DIAGNOSIS — Z7189 Other specified counseling: Secondary | ICD-10-CM | POA: Diagnosis not present

## 2017-08-05 DIAGNOSIS — Z1339 Encounter for screening examination for other mental health and behavioral disorders: Secondary | ICD-10-CM | POA: Diagnosis not present

## 2017-08-05 DIAGNOSIS — Z1331 Encounter for screening for depression: Secondary | ICD-10-CM | POA: Diagnosis not present

## 2017-08-05 DIAGNOSIS — Z79899 Other long term (current) drug therapy: Secondary | ICD-10-CM | POA: Diagnosis not present

## 2017-08-05 DIAGNOSIS — Z1211 Encounter for screening for malignant neoplasm of colon: Secondary | ICD-10-CM | POA: Diagnosis not present

## 2017-08-05 DIAGNOSIS — Z0000 Encounter for general adult medical examination without abnormal findings: Secondary | ICD-10-CM | POA: Diagnosis not present

## 2017-08-05 DIAGNOSIS — Z299 Encounter for prophylactic measures, unspecified: Secondary | ICD-10-CM | POA: Diagnosis not present

## 2017-08-26 DIAGNOSIS — Z01419 Encounter for gynecological examination (general) (routine) without abnormal findings: Secondary | ICD-10-CM | POA: Diagnosis not present

## 2017-08-26 DIAGNOSIS — Z124 Encounter for screening for malignant neoplasm of cervix: Secondary | ICD-10-CM | POA: Diagnosis not present

## 2017-09-03 DIAGNOSIS — E2839 Other primary ovarian failure: Secondary | ICD-10-CM | POA: Diagnosis not present

## 2017-11-01 DIAGNOSIS — H43813 Vitreous degeneration, bilateral: Secondary | ICD-10-CM | POA: Diagnosis not present

## 2017-11-01 DIAGNOSIS — H2513 Age-related nuclear cataract, bilateral: Secondary | ICD-10-CM | POA: Diagnosis not present

## 2017-11-01 DIAGNOSIS — H401132 Primary open-angle glaucoma, bilateral, moderate stage: Secondary | ICD-10-CM | POA: Diagnosis not present

## 2017-12-13 DIAGNOSIS — J45909 Unspecified asthma, uncomplicated: Secondary | ICD-10-CM | POA: Diagnosis not present

## 2017-12-13 DIAGNOSIS — J301 Allergic rhinitis due to pollen: Secondary | ICD-10-CM | POA: Diagnosis not present

## 2018-01-01 DIAGNOSIS — L908 Other atrophic disorders of skin: Secondary | ICD-10-CM | POA: Diagnosis not present

## 2018-01-01 DIAGNOSIS — L578 Other skin changes due to chronic exposure to nonionizing radiation: Secondary | ICD-10-CM | POA: Diagnosis not present

## 2018-03-04 DIAGNOSIS — H2513 Age-related nuclear cataract, bilateral: Secondary | ICD-10-CM | POA: Diagnosis not present

## 2018-03-04 DIAGNOSIS — H43813 Vitreous degeneration, bilateral: Secondary | ICD-10-CM | POA: Diagnosis not present

## 2018-03-04 DIAGNOSIS — H401132 Primary open-angle glaucoma, bilateral, moderate stage: Secondary | ICD-10-CM | POA: Diagnosis not present

## 2018-04-07 DIAGNOSIS — Z23 Encounter for immunization: Secondary | ICD-10-CM | POA: Diagnosis not present

## 2018-04-10 ENCOUNTER — Other Ambulatory Visit: Payer: Self-pay | Admitting: Internal Medicine

## 2018-04-10 DIAGNOSIS — Z1231 Encounter for screening mammogram for malignant neoplasm of breast: Secondary | ICD-10-CM

## 2018-05-23 ENCOUNTER — Ambulatory Visit
Admission: RE | Admit: 2018-05-23 | Discharge: 2018-05-23 | Disposition: A | Discharge status: Home / Self Care | Payer: Medicare Other | Source: Ambulatory Visit | Attending: Internal Medicine | Admitting: Internal Medicine

## 2018-05-23 DIAGNOSIS — Z1231 Encounter for screening mammogram for malignant neoplasm of breast: Secondary | ICD-10-CM | POA: Diagnosis not present

## 2018-06-06 DIAGNOSIS — Z23 Encounter for immunization: Secondary | ICD-10-CM | POA: Diagnosis not present

## 2018-07-01 DIAGNOSIS — R05 Cough: Secondary | ICD-10-CM | POA: Diagnosis not present

## 2018-07-01 DIAGNOSIS — J4521 Mild intermittent asthma with (acute) exacerbation: Secondary | ICD-10-CM | POA: Diagnosis not present

## 2018-07-01 DIAGNOSIS — J209 Acute bronchitis, unspecified: Secondary | ICD-10-CM | POA: Diagnosis not present

## 2018-07-08 DIAGNOSIS — H43813 Vitreous degeneration, bilateral: Secondary | ICD-10-CM | POA: Diagnosis not present

## 2018-07-08 DIAGNOSIS — H401132 Primary open-angle glaucoma, bilateral, moderate stage: Secondary | ICD-10-CM | POA: Diagnosis not present

## 2018-07-08 DIAGNOSIS — H2513 Age-related nuclear cataract, bilateral: Secondary | ICD-10-CM | POA: Diagnosis not present

## 2018-07-28 DIAGNOSIS — H401112 Primary open-angle glaucoma, right eye, moderate stage: Secondary | ICD-10-CM | POA: Diagnosis not present

## 2018-08-08 DIAGNOSIS — Z79899 Other long term (current) drug therapy: Secondary | ICD-10-CM | POA: Diagnosis not present

## 2018-08-08 DIAGNOSIS — F419 Anxiety disorder, unspecified: Secondary | ICD-10-CM | POA: Diagnosis not present

## 2018-08-08 DIAGNOSIS — R5383 Other fatigue: Secondary | ICD-10-CM | POA: Diagnosis not present

## 2018-08-08 DIAGNOSIS — Z1211 Encounter for screening for malignant neoplasm of colon: Secondary | ICD-10-CM | POA: Diagnosis not present

## 2018-08-08 DIAGNOSIS — Z0000 Encounter for general adult medical examination without abnormal findings: Secondary | ICD-10-CM | POA: Diagnosis not present

## 2018-08-08 DIAGNOSIS — Z299 Encounter for prophylactic measures, unspecified: Secondary | ICD-10-CM | POA: Diagnosis not present

## 2018-08-08 DIAGNOSIS — Z7189 Other specified counseling: Secondary | ICD-10-CM | POA: Diagnosis not present

## 2018-08-08 DIAGNOSIS — E7800 Pure hypercholesterolemia, unspecified: Secondary | ICD-10-CM | POA: Diagnosis not present

## 2018-08-08 DIAGNOSIS — Z1339 Encounter for screening examination for other mental health and behavioral disorders: Secondary | ICD-10-CM | POA: Diagnosis not present

## 2018-08-08 DIAGNOSIS — Z1331 Encounter for screening for depression: Secondary | ICD-10-CM | POA: Diagnosis not present

## 2018-08-08 DIAGNOSIS — Z6822 Body mass index (BMI) 22.0-22.9, adult: Secondary | ICD-10-CM | POA: Diagnosis not present

## 2018-09-15 DIAGNOSIS — H2513 Age-related nuclear cataract, bilateral: Secondary | ICD-10-CM | POA: Diagnosis not present

## 2018-09-15 DIAGNOSIS — H401132 Primary open-angle glaucoma, bilateral, moderate stage: Secondary | ICD-10-CM | POA: Diagnosis not present

## 2018-11-13 DIAGNOSIS — Z01818 Encounter for other preprocedural examination: Secondary | ICD-10-CM | POA: Diagnosis not present

## 2018-11-13 DIAGNOSIS — Z1159 Encounter for screening for other viral diseases: Secondary | ICD-10-CM | POA: Diagnosis not present

## 2018-11-18 DIAGNOSIS — J45909 Unspecified asthma, uncomplicated: Secondary | ICD-10-CM | POA: Diagnosis not present

## 2018-11-18 DIAGNOSIS — R0602 Shortness of breath: Secondary | ICD-10-CM | POA: Diagnosis not present

## 2018-11-24 DIAGNOSIS — H2513 Age-related nuclear cataract, bilateral: Secondary | ICD-10-CM | POA: Diagnosis not present

## 2018-11-24 DIAGNOSIS — H43813 Vitreous degeneration, bilateral: Secondary | ICD-10-CM | POA: Diagnosis not present

## 2018-11-24 DIAGNOSIS — H401132 Primary open-angle glaucoma, bilateral, moderate stage: Secondary | ICD-10-CM | POA: Diagnosis not present

## 2018-12-02 DIAGNOSIS — Z6823 Body mass index (BMI) 23.0-23.9, adult: Secondary | ICD-10-CM | POA: Diagnosis not present

## 2018-12-02 DIAGNOSIS — N390 Urinary tract infection, site not specified: Secondary | ICD-10-CM | POA: Diagnosis not present

## 2018-12-02 DIAGNOSIS — Z299 Encounter for prophylactic measures, unspecified: Secondary | ICD-10-CM | POA: Diagnosis not present

## 2018-12-02 DIAGNOSIS — R350 Frequency of micturition: Secondary | ICD-10-CM | POA: Diagnosis not present

## 2018-12-02 DIAGNOSIS — Z87891 Personal history of nicotine dependence: Secondary | ICD-10-CM | POA: Diagnosis not present

## 2019-01-02 DIAGNOSIS — R300 Dysuria: Secondary | ICD-10-CM | POA: Diagnosis not present

## 2019-01-02 DIAGNOSIS — R350 Frequency of micturition: Secondary | ICD-10-CM | POA: Diagnosis not present

## 2019-03-25 DIAGNOSIS — H401132 Primary open-angle glaucoma, bilateral, moderate stage: Secondary | ICD-10-CM | POA: Diagnosis not present

## 2019-03-25 DIAGNOSIS — H43813 Vitreous degeneration, bilateral: Secondary | ICD-10-CM | POA: Diagnosis not present

## 2019-03-25 DIAGNOSIS — H2513 Age-related nuclear cataract, bilateral: Secondary | ICD-10-CM | POA: Diagnosis not present

## 2019-03-26 DIAGNOSIS — R350 Frequency of micturition: Secondary | ICD-10-CM | POA: Diagnosis not present

## 2019-03-26 DIAGNOSIS — Z299 Encounter for prophylactic measures, unspecified: Secondary | ICD-10-CM | POA: Diagnosis not present

## 2019-03-26 DIAGNOSIS — F419 Anxiety disorder, unspecified: Secondary | ICD-10-CM | POA: Diagnosis not present

## 2019-03-26 DIAGNOSIS — Z6824 Body mass index (BMI) 24.0-24.9, adult: Secondary | ICD-10-CM | POA: Diagnosis not present

## 2019-03-26 DIAGNOSIS — N390 Urinary tract infection, site not specified: Secondary | ICD-10-CM | POA: Diagnosis not present

## 2019-04-10 DIAGNOSIS — R42 Dizziness and giddiness: Secondary | ICD-10-CM | POA: Diagnosis not present

## 2019-04-10 DIAGNOSIS — L708 Other acne: Secondary | ICD-10-CM | POA: Diagnosis not present

## 2019-04-10 DIAGNOSIS — Z299 Encounter for prophylactic measures, unspecified: Secondary | ICD-10-CM | POA: Diagnosis not present

## 2019-04-10 DIAGNOSIS — F419 Anxiety disorder, unspecified: Secondary | ICD-10-CM | POA: Diagnosis not present

## 2019-04-10 DIAGNOSIS — R350 Frequency of micturition: Secondary | ICD-10-CM | POA: Diagnosis not present

## 2019-04-10 DIAGNOSIS — Z6824 Body mass index (BMI) 24.0-24.9, adult: Secondary | ICD-10-CM | POA: Diagnosis not present

## 2019-04-20 ENCOUNTER — Other Ambulatory Visit: Payer: Self-pay | Admitting: Internal Medicine

## 2019-04-20 DIAGNOSIS — Z1231 Encounter for screening mammogram for malignant neoplasm of breast: Secondary | ICD-10-CM

## 2019-04-21 DIAGNOSIS — G9009 Other idiopathic peripheral autonomic neuropathy: Secondary | ICD-10-CM | POA: Diagnosis not present

## 2019-04-21 DIAGNOSIS — Z23 Encounter for immunization: Secondary | ICD-10-CM | POA: Diagnosis not present

## 2019-04-21 DIAGNOSIS — M79604 Pain in right leg: Secondary | ICD-10-CM | POA: Diagnosis not present

## 2019-04-21 DIAGNOSIS — H81393 Other peripheral vertigo, bilateral: Secondary | ICD-10-CM | POA: Diagnosis not present

## 2019-05-08 DIAGNOSIS — Z6824 Body mass index (BMI) 24.0-24.9, adult: Secondary | ICD-10-CM | POA: Diagnosis not present

## 2019-05-08 DIAGNOSIS — J45909 Unspecified asthma, uncomplicated: Secondary | ICD-10-CM | POA: Diagnosis not present

## 2019-05-08 DIAGNOSIS — Z299 Encounter for prophylactic measures, unspecified: Secondary | ICD-10-CM | POA: Diagnosis not present

## 2019-05-08 DIAGNOSIS — R42 Dizziness and giddiness: Secondary | ICD-10-CM | POA: Diagnosis not present

## 2019-05-08 DIAGNOSIS — F419 Anxiety disorder, unspecified: Secondary | ICD-10-CM | POA: Diagnosis not present

## 2019-06-15 ENCOUNTER — Other Ambulatory Visit: Payer: Self-pay

## 2019-06-15 ENCOUNTER — Ambulatory Visit
Admission: RE | Admit: 2019-06-15 | Discharge: 2019-06-15 | Disposition: A | Discharge status: Home / Self Care | Payer: Medicare Other | Source: Ambulatory Visit | Attending: Internal Medicine | Admitting: Internal Medicine

## 2019-06-15 DIAGNOSIS — Z1231 Encounter for screening mammogram for malignant neoplasm of breast: Secondary | ICD-10-CM

## 2019-07-15 IMAGING — MG 2D DIGITAL SCREENING BILATERAL MAMMOGRAM WITH CAD AND ADJUNCT TO
9 of 12 series · 9 of 28 positions shown · non-contrast
Comparison: Previous exam(s).

CLINICAL DATA: Screening.

EXAM:
2D DIGITAL SCREENING BILATERAL MAMMOGRAM WITH CAD AND ADJUNCT TOMO

[L CC synth-2D]
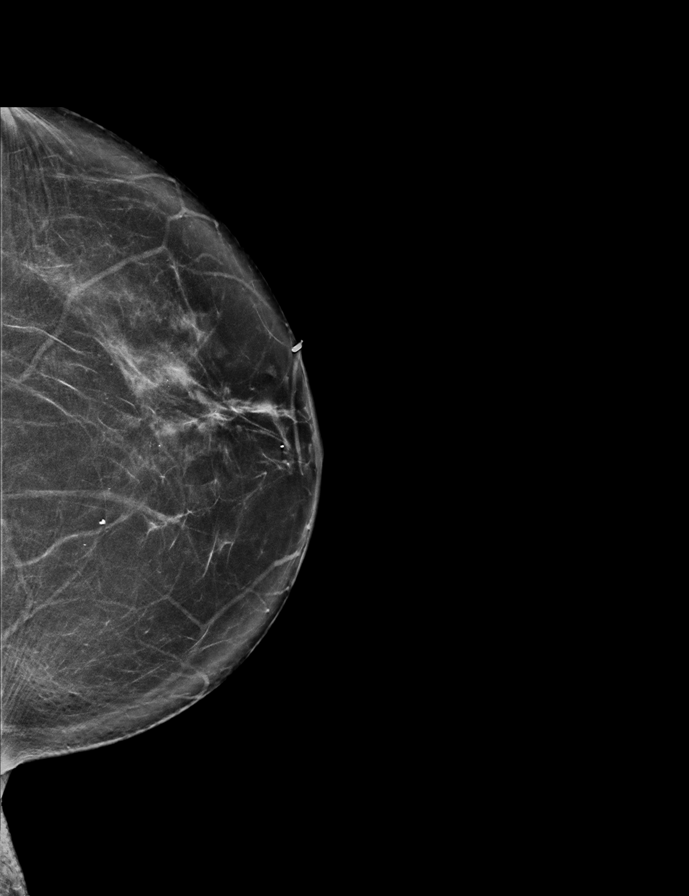

[L CC]
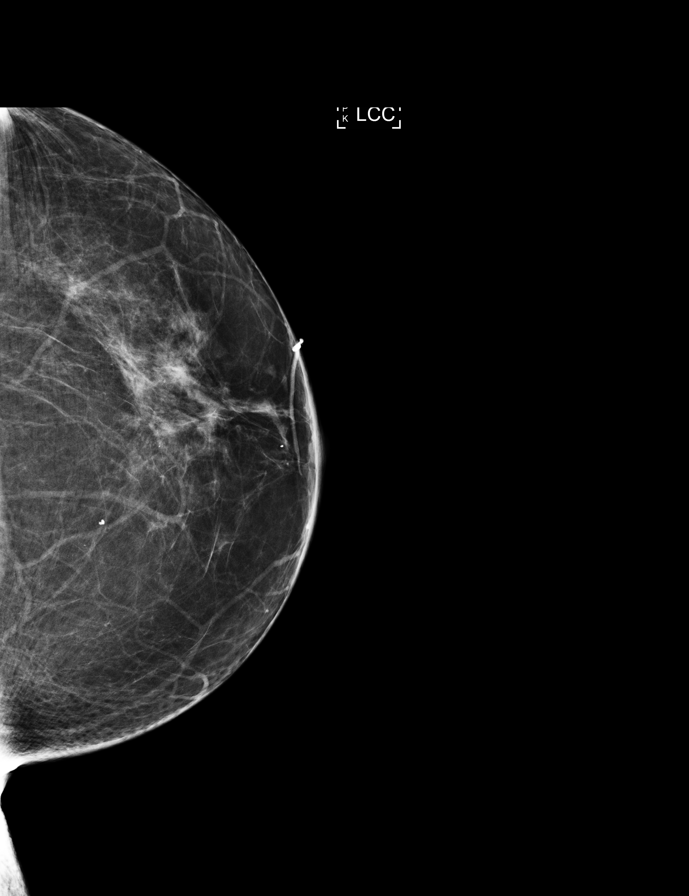

[R CC synth-2D]
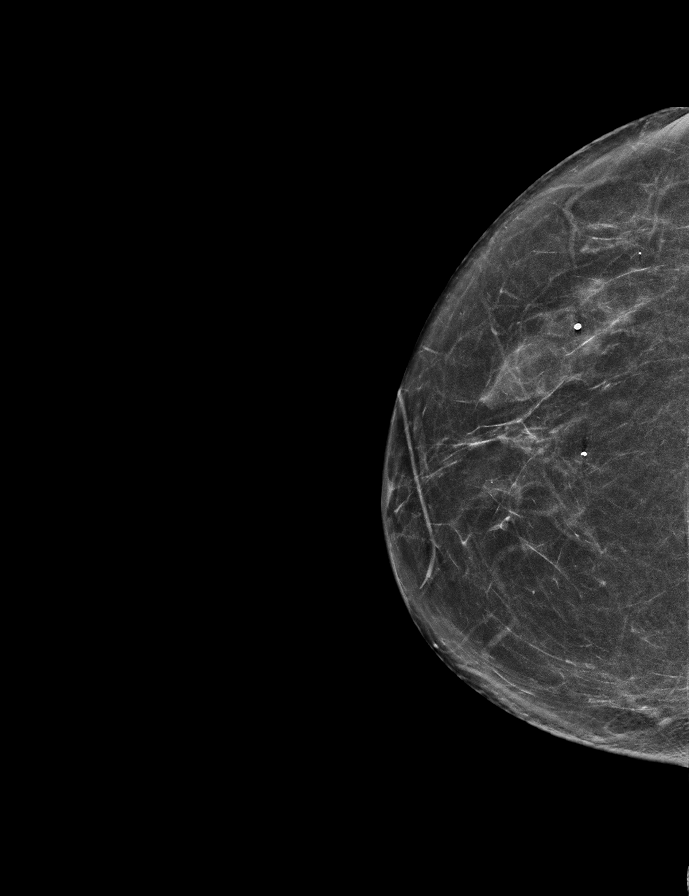

[L MLO]
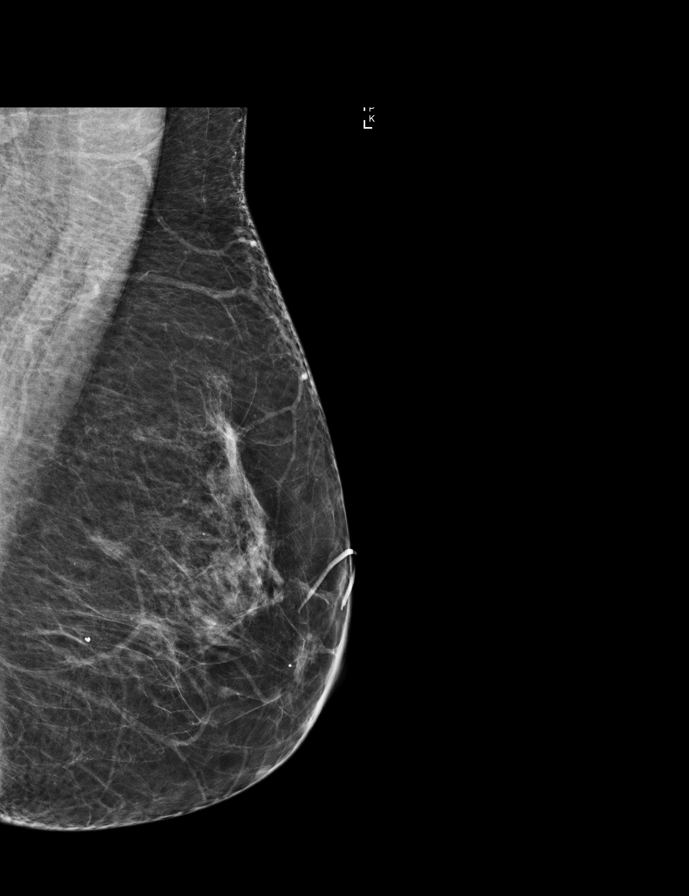

[L MLO synth-2D]
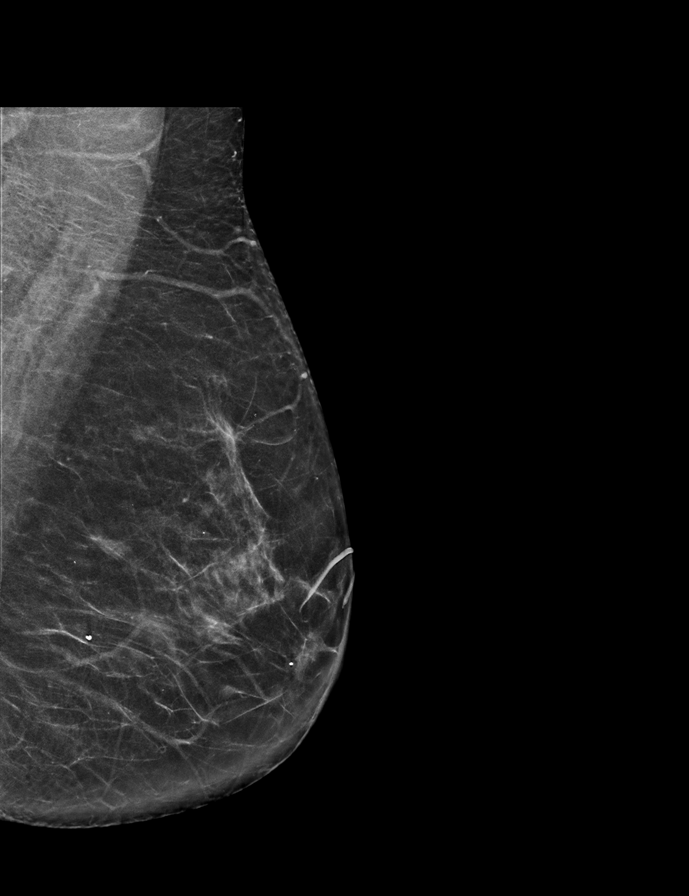

[R MLO synth-2D]
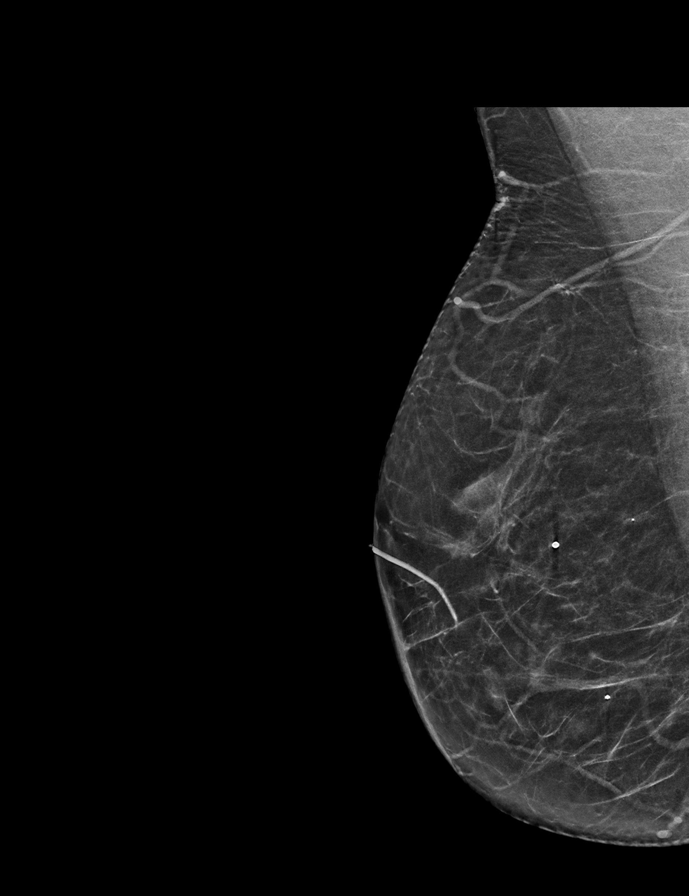

[R MLO]
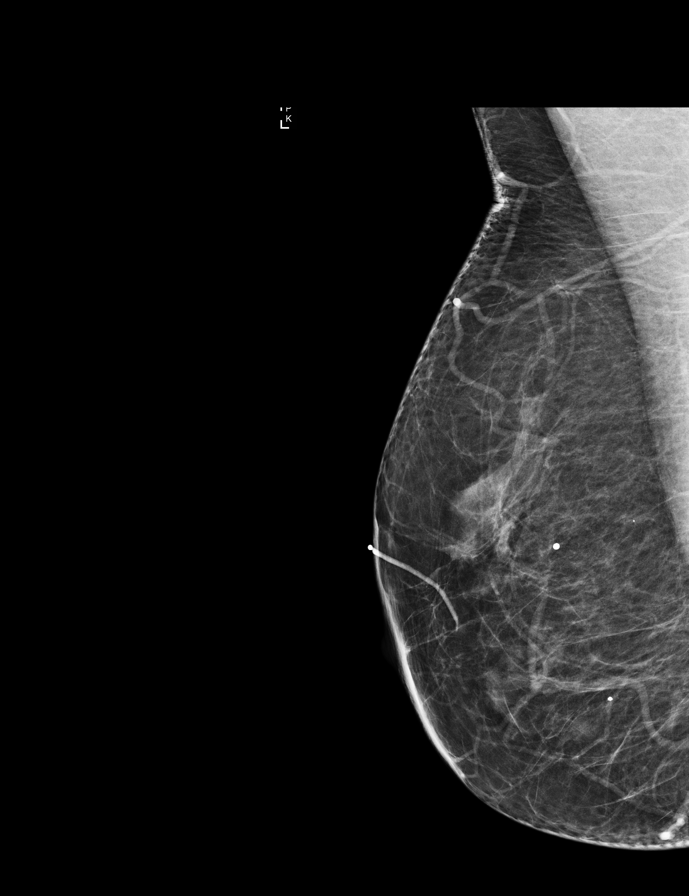

[R CC]
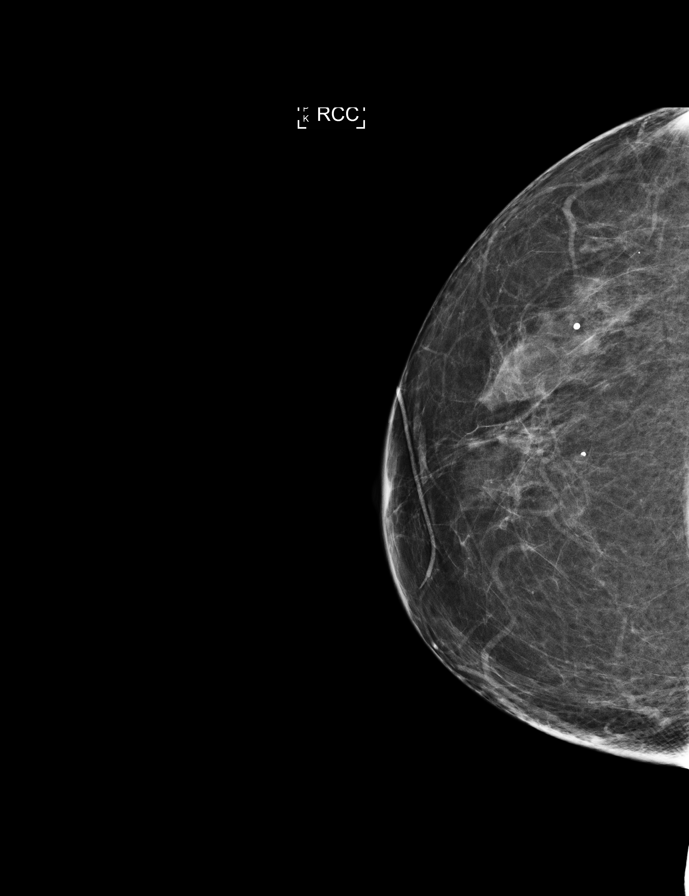

[L CC tomo · tomo slice 33/64.0]
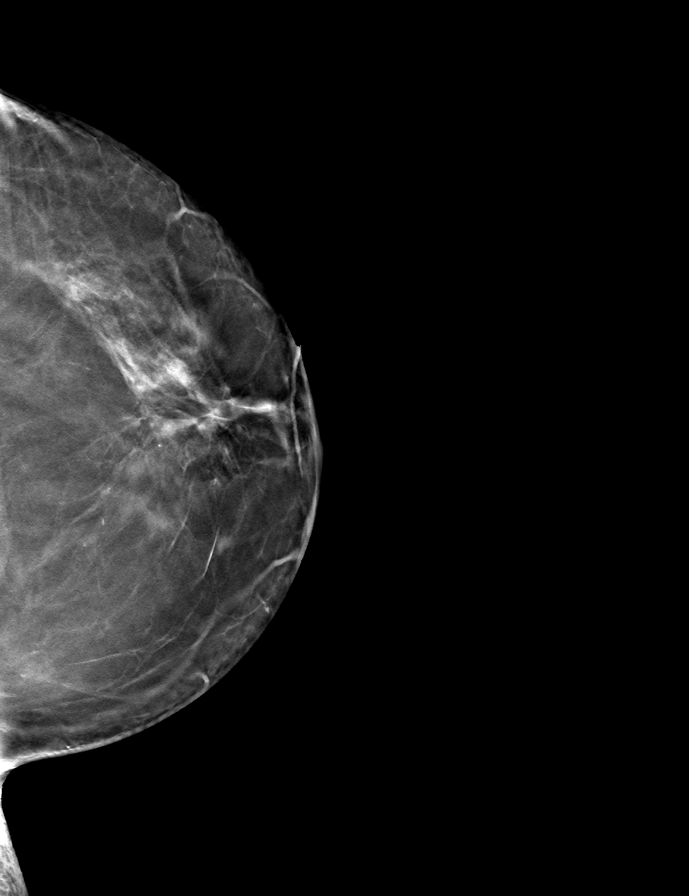

[9 of 28 positions shown; findings below may reference images not displayed]

ACR Breast Density Category b: There are scattered areas of
fibroglandular density.
FINDINGS: There are no findings suspicious for malignancy. Images were
processed with CAD.
IMPRESSION: No mammographic evidence of malignancy. A result letter of this
screening mammogram will be mailed directly to the patient.

RECOMMENDATION:
Screening mammogram in one year. (Code:97-6-RS4)

BI-RADS CATEGORY  1: Negative.

## 2019-07-24 DIAGNOSIS — H2513 Age-related nuclear cataract, bilateral: Secondary | ICD-10-CM | POA: Diagnosis not present

## 2019-07-24 DIAGNOSIS — H43813 Vitreous degeneration, bilateral: Secondary | ICD-10-CM | POA: Diagnosis not present

## 2019-07-24 DIAGNOSIS — H401132 Primary open-angle glaucoma, bilateral, moderate stage: Secondary | ICD-10-CM | POA: Diagnosis not present

## 2019-09-17 DIAGNOSIS — Z23 Encounter for immunization: Secondary | ICD-10-CM | POA: Diagnosis not present

## 2019-09-24 DIAGNOSIS — Z6824 Body mass index (BMI) 24.0-24.9, adult: Secondary | ICD-10-CM | POA: Diagnosis not present

## 2019-09-24 DIAGNOSIS — Z1331 Encounter for screening for depression: Secondary | ICD-10-CM | POA: Diagnosis not present

## 2019-09-24 DIAGNOSIS — E2839 Other primary ovarian failure: Secondary | ICD-10-CM | POA: Diagnosis not present

## 2019-09-24 DIAGNOSIS — Z79899 Other long term (current) drug therapy: Secondary | ICD-10-CM | POA: Diagnosis not present

## 2019-09-24 DIAGNOSIS — Z1211 Encounter for screening for malignant neoplasm of colon: Secondary | ICD-10-CM | POA: Diagnosis not present

## 2019-09-24 DIAGNOSIS — Z0000 Encounter for general adult medical examination without abnormal findings: Secondary | ICD-10-CM | POA: Diagnosis not present

## 2019-09-24 DIAGNOSIS — E7800 Pure hypercholesterolemia, unspecified: Secondary | ICD-10-CM | POA: Diagnosis not present

## 2019-09-24 DIAGNOSIS — Z299 Encounter for prophylactic measures, unspecified: Secondary | ICD-10-CM | POA: Diagnosis not present

## 2019-09-24 DIAGNOSIS — F419 Anxiety disorder, unspecified: Secondary | ICD-10-CM | POA: Diagnosis not present

## 2019-09-24 DIAGNOSIS — Z7189 Other specified counseling: Secondary | ICD-10-CM | POA: Diagnosis not present

## 2019-09-24 DIAGNOSIS — Z1339 Encounter for screening examination for other mental health and behavioral disorders: Secondary | ICD-10-CM | POA: Diagnosis not present

## 2019-10-05 DIAGNOSIS — E2839 Other primary ovarian failure: Secondary | ICD-10-CM | POA: Diagnosis not present

## 2019-10-05 DIAGNOSIS — M859 Disorder of bone density and structure, unspecified: Secondary | ICD-10-CM | POA: Diagnosis not present

## 2019-10-14 DIAGNOSIS — Z23 Encounter for immunization: Secondary | ICD-10-CM | POA: Diagnosis not present

## 2019-11-18 DIAGNOSIS — J45909 Unspecified asthma, uncomplicated: Secondary | ICD-10-CM | POA: Diagnosis not present

## 2019-11-25 DIAGNOSIS — H43813 Vitreous degeneration, bilateral: Secondary | ICD-10-CM | POA: Diagnosis not present

## 2019-11-25 DIAGNOSIS — H2513 Age-related nuclear cataract, bilateral: Secondary | ICD-10-CM | POA: Diagnosis not present

## 2019-11-25 DIAGNOSIS — H401132 Primary open-angle glaucoma, bilateral, moderate stage: Secondary | ICD-10-CM | POA: Diagnosis not present

## 2019-12-02 DIAGNOSIS — Z87891 Personal history of nicotine dependence: Secondary | ICD-10-CM | POA: Diagnosis not present

## 2019-12-02 DIAGNOSIS — L709 Acne, unspecified: Secondary | ICD-10-CM | POA: Diagnosis not present

## 2019-12-02 DIAGNOSIS — Z299 Encounter for prophylactic measures, unspecified: Secondary | ICD-10-CM | POA: Diagnosis not present

## 2020-01-06 DIAGNOSIS — H401132 Primary open-angle glaucoma, bilateral, moderate stage: Secondary | ICD-10-CM | POA: Diagnosis not present

## 2020-01-06 DIAGNOSIS — H2513 Age-related nuclear cataract, bilateral: Secondary | ICD-10-CM | POA: Diagnosis not present

## 2020-01-06 DIAGNOSIS — H43813 Vitreous degeneration, bilateral: Secondary | ICD-10-CM | POA: Diagnosis not present

## 2020-02-17 DIAGNOSIS — H2513 Age-related nuclear cataract, bilateral: Secondary | ICD-10-CM | POA: Diagnosis not present

## 2020-02-17 DIAGNOSIS — H43813 Vitreous degeneration, bilateral: Secondary | ICD-10-CM | POA: Diagnosis not present

## 2020-02-17 DIAGNOSIS — H401132 Primary open-angle glaucoma, bilateral, moderate stage: Secondary | ICD-10-CM | POA: Diagnosis not present

## 2020-03-03 DIAGNOSIS — Z299 Encounter for prophylactic measures, unspecified: Secondary | ICD-10-CM | POA: Diagnosis not present

## 2020-03-03 DIAGNOSIS — Z87891 Personal history of nicotine dependence: Secondary | ICD-10-CM | POA: Diagnosis not present

## 2020-03-03 DIAGNOSIS — F419 Anxiety disorder, unspecified: Secondary | ICD-10-CM | POA: Diagnosis not present

## 2020-03-03 DIAGNOSIS — J45909 Unspecified asthma, uncomplicated: Secondary | ICD-10-CM | POA: Diagnosis not present

## 2020-03-07 DIAGNOSIS — R05 Cough: Secondary | ICD-10-CM | POA: Diagnosis not present

## 2020-04-20 DIAGNOSIS — R0602 Shortness of breath: Secondary | ICD-10-CM | POA: Diagnosis not present

## 2020-04-20 DIAGNOSIS — J45909 Unspecified asthma, uncomplicated: Secondary | ICD-10-CM | POA: Diagnosis not present

## 2020-04-20 DIAGNOSIS — K219 Gastro-esophageal reflux disease without esophagitis: Secondary | ICD-10-CM | POA: Diagnosis not present

## 2020-04-20 DIAGNOSIS — J301 Allergic rhinitis due to pollen: Secondary | ICD-10-CM | POA: Diagnosis not present

## 2020-05-02 ENCOUNTER — Encounter: Payer: Self-pay | Admitting: Cardiology

## 2020-05-02 ENCOUNTER — Encounter: Payer: Self-pay | Admitting: *Deleted

## 2020-05-02 NOTE — Progress Notes (Signed)
Cardiology Office Note  Date: 05/03/2020   ID: Mackenzie EUTSLER, DOB 12-Nov-1950, MRN [id number]  PCP:  Glenda Chroman, MD  Cardiologist:  Rozann Lesches, MD Electrophysiologist:  None   Chief Complaint  Patient presents with  . Shortness of Breath    History of Present Illness: Mackenzie Craig is a 69 y.o. female referred for cardiology consultation by Dr. Koleen Nimrod for evaluation of shortness of breath.  She has a history of allergic rhinitis and longstanding asthma, follows regularly with Dr. Koleen Nimrod and has been tolerating her pulmonary regimen fairly well.  She indicates a feeling of shortness of breath, more so that she cannot take a deep breath and feels that her "bronchial tubes are tight" when she is sitting still.  Not reporting frank wheezing, feels somewhat different from her more typical asthma symptoms.  This seems to be less of a problem when she is up moving around or even exercising.  She goes to the gym at least 3 days a week, walks on a treadmill for 30 minutes, and does weights for 30 minutes.  She states that since her eyedrops for treatment of glaucoma were adjusted, her symptoms seem to be worse.  She recalls not tolerating Timolol eyedrops in the past.  She is currently on dorzolamide eyedrops which can be associated with bronchospasm.  She is also on latanoprost eyedrops which report a potential source of angina symptoms.  She has no personal history of ischemic heart disease or cardiomyopathy.  I reviewed her ECG today which shows normal sinus rhythm.  Past Medical History:  Diagnosis Date  . Asthma   . GERD (gastroesophageal reflux disease)   . Seasonal allergic rhinitis due to pollen     Past Surgical History:  Procedure Laterality Date  . BREAST EXCISIONAL BIOPSY Bilateral    benign  . COLONOSCOPY N/A 01/11/2016   Procedure: COLONOSCOPY;  Surgeon: Rogene Houston, MD;  [facility];  Service: Endoscopy;  Laterality: N/A;  12:00  . SLT LASER  APPLICATION Left 0/62/6948   Procedure: SLT LASER APPLICATION;  Surgeon: Williams Che, MD;  Location: [facility];  Service: Ophthalmology;  Laterality: Left;    Current Outpatient Medications  Medication Sig Dispense Refill  . acetaminophen (TYLENOL) 500 MG tablet Take 500 mg by mouth every 6 (six) hours as needed.    Marland Kitchen albuterol (VENTOLIN HFA) 108 (90 Base) MCG/ACT inhaler Inhale into the lungs every 6 (six) hours as needed for wheezing or shortness of breath.    . Biotin 1000 MCG tablet Take 1,000 mcg by mouth 3 (three) times daily.    . budesonide-formoterol (SYMBICORT) 160-4.5 MCG/ACT inhaler Inhale 2 puffs into the lungs 2 (two) times daily.    . Calcium Citrate (CITRACAL PO) Take by mouth.    . cetirizine (ZYRTEC) 10 MG tablet Take 10 mg by mouth daily.    . Cranberry 500 MG CAPS Take by mouth.    . D-Mannose 500 MG CAPS Take by mouth.    . diazepam (VALIUM) 2 MG tablet Take 2 mg by mouth every 6 (six) hours as needed for anxiety.    . fexofenadine (ALLEGRA) 180 MG tablet Take 180 mg by mouth daily.    Marland Kitchen ibuprofen (ADVIL) 200 MG tablet Take 200 mg by mouth every 6 (six) hours as needed.    . loratadine (CLARITIN) 10 MG tablet Take 10 mg by mouth daily.    . Multiple Vitamin (MULTIVITAMIN WITH MINERALS) TABS tablet Take 1 tablet  by mouth daily.    Marland Kitchen omeprazole (PRILOSEC) 20 MG capsule Take 20 mg by mouth daily.    Marland Kitchen tretinoin (RETIN-A) 0.1 % cream Apply 1 application topically at bedtime as needed (ACNE).   99   No current facility-administered medications for this visit.   Allergies:  Patient has no known allergies.   Social History: The patient  reports that she has never smoked. She has never used smokeless tobacco. She reports that she does not drink alcohol and does not use drugs.   Family History: The patient's family history includes Diabetes Mellitus II in her father; Rheum arthritis in her mother; Stroke in her sister; Transient ischemic attack in her father.   ROS: No  palpitations or syncope.  Physical Exam: VS:  BP 118/72   Pulse 77   Ht 5\' 6"  (1.676 m)   Wt 146 lb (66.2 kg)   SpO2 97%   BMI 23.57 kg/m , BMI Body mass index is 23.57 kg/m.  Wt Readings from Last 3 Encounters:  05/03/20 146 lb (66.2 kg)  01/11/16 158 lb (71.7 kg)  11/09/15 164 lb 8 oz (74.6 kg)    General: Patient appears comfortable at rest. HEENT: Conjunctiva and lids normal, wearing a mask. Neck: Supple, no elevated JVP or carotid bruits, no thyromegaly. Lungs: Clear to auscultation, nonlabored breathing at rest. Cardiac: Regular rate and rhythm, no S3 or significant systolic murmur, no pericardial rub. Abdomen: Soft, nontender, bowel sounds present. Extremities: No pitting edema, distal pulses 2+. Skin: Warm and dry. Musculoskeletal: No kyphosis. Neuropsychiatric: Alert and oriented x3, affect grossly appropriate.  ECG:  No old tracing available for review today.  Recent Labwork:  No recent lab work for review today.  Other Studies Reviewed Today:  Chest x-ray 04/20/2020: Lung volumes and heart size within normal limits.  No consolidation.  Coarse lung markings.  No large effusion or pneumothorax.  Degenerative changes in the spine.  Assessment and Plan:  1.  Shortness of breath as described above.  Symptoms are atypical for angina, seem to be less provoked by activity, still not entirely consistent with her typical asthma symptoms.  I do wonder whether her current eyedrop regimen is contributing as discussed above.  ECG is normal today.  To objectively assess cardiac status, we will obtain a GXT and also an echocardiogram to ensure no further work-up is necessary.  2.  GERD, currently on Prilosec.  Medication Adjustments/Labs and Tests Ordered: Current medicines are reviewed at length with the patient today.  Concerns regarding medicines are outlined above.   Tests Ordered: Orders Placed This Encounter  Procedures  . EXERCISE TOLERANCE TEST (ETT)  . EKG  12-Lead  . ECHOCARDIOGRAM COMPLETE    Medication Changes: No orders of the defined types were placed in this encounter.   Disposition:  Follow up test results.  Signed, Satira Sark, MD, [facility] 05/03/2020 11:03 AM    [facility] [place], [place], [place] [address] Phone: [phone]; Fax: [phone]

## 2020-05-03 ENCOUNTER — Encounter: Payer: Self-pay | Admitting: *Deleted

## 2020-05-03 ENCOUNTER — Ambulatory Visit (INDEPENDENT_AMBULATORY_CARE_PROVIDER_SITE_OTHER): Payer: Medicare Other | Admitting: Cardiology

## 2020-05-03 ENCOUNTER — Encounter: Payer: Self-pay | Admitting: Cardiology

## 2020-05-03 VITALS — BP 118/72 | HR 77 | Ht 66.0 in | Wt 146.0 lb

## 2020-05-03 DIAGNOSIS — R0602 Shortness of breath: Secondary | ICD-10-CM

## 2020-05-03 DIAGNOSIS — K219 Gastro-esophageal reflux disease without esophagitis: Secondary | ICD-10-CM | POA: Diagnosis not present

## 2020-05-03 NOTE — Patient Instructions (Addendum)
Medication Instructions:   Your physician recommends that you continue on your current medications as directed. Please refer to the Current Medication list given to you today.  Labwork:  None  Testing/Procedures: Your physician has requested that you have an exercise tolerance test. For further information please visit [email or web address]. Please also follow instruction sheet, as given. Your physician has requested that you have an echocardiogram. Echocardiography is a painless test that uses sound waves to create images of your heart. It provides your doctor with information about the size and shape of your heart and how well your heart's chambers and valves are working. This procedure takes approximately one hour. There are no restrictions for this procedure.  Follow-Up:  Your physician recommends that you schedule a follow-up appointment in: pending.  Any Other Special Instructions Will Be Listed Below (If Applicable).  If you need a refill on your cardiac medications before your next appointment, please call your pharmacy.

## 2020-05-05 DIAGNOSIS — Z23 Encounter for immunization: Secondary | ICD-10-CM | POA: Diagnosis not present

## 2020-05-06 ENCOUNTER — Other Ambulatory Visit (HOSPITAL_COMMUNITY)
Admission: RE | Admit: 2020-05-06 | Discharge: 2020-05-06 | Disposition: A | Discharge status: Home / Self Care | Payer: Medicare Other | Source: Ambulatory Visit | Attending: Cardiology | Admitting: Cardiology

## 2020-05-06 ENCOUNTER — Other Ambulatory Visit: Payer: Self-pay

## 2020-05-06 DIAGNOSIS — Z01812 Encounter for preprocedural laboratory examination: Secondary | ICD-10-CM | POA: Insufficient documentation

## 2020-05-06 DIAGNOSIS — Z20822 Contact with and (suspected) exposure to covid-19: Secondary | ICD-10-CM | POA: Diagnosis not present

## 2020-05-06 LAB — SARS CORONAVIRUS 2 (TAT 6-24 HRS): SARS Coronavirus 2: NEGATIVE

## 2020-05-09 ENCOUNTER — Other Ambulatory Visit: Payer: Self-pay

## 2020-05-09 ENCOUNTER — Ambulatory Visit (HOSPITAL_COMMUNITY)
Admission: RE | Admit: 2020-05-09 | Discharge: 2020-05-09 | Disposition: A | Discharge status: Home / Self Care | Payer: Medicare Other | Source: Ambulatory Visit | Attending: Cardiology | Admitting: Cardiology

## 2020-05-09 ENCOUNTER — Ambulatory Visit (HOSPITAL_BASED_OUTPATIENT_CLINIC_OR_DEPARTMENT_OTHER)
Admission: RE | Admit: 2020-05-09 | Discharge: 2020-05-09 | Disposition: A | Discharge status: Home / Self Care | Payer: Medicare Other | Source: Ambulatory Visit | Attending: Cardiology | Admitting: Cardiology

## 2020-05-09 DIAGNOSIS — R0602 Shortness of breath: Secondary | ICD-10-CM | POA: Insufficient documentation

## 2020-05-09 LAB — EXERCISE TOLERANCE TEST
Estimated workload: 7 METS
Exercise duration (min): 5 min
Exercise duration (sec): 31 s
MPHR: 150 {beats}/min
Peak HR: 133 {beats}/min
Percent HR: 88 %
RPE: 13
Rest HR: 68 {beats}/min

## 2020-05-09 LAB — ECHOCARDIOGRAM COMPLETE
Area-P 1/2: 3.19 cm2
S' Lateral: 2.4 cm

## 2020-05-09 NOTE — Progress Notes (Signed)
*  PRELIMINARY RESULTS* Echocardiogram 2D Echocardiogram has been performed.  Samuel Germany 05/09/2020, 12:02 PM

## 2020-05-10 ENCOUNTER — Telehealth: Payer: Self-pay | Admitting: *Deleted

## 2020-05-10 NOTE — Telephone Encounter (Signed)
-----   Message from Satira Sark, MD sent at 05/09/2020  1:18 PM EST ----- Results reviewed.  Cardiac function is vigorous with LVEF 70 to 43%, mild diastolic dysfunction would be commonly seen and not necessarily expected to be causing her current shortness of breath.  Also right ventricular contraction normal with normal estimated pulmonary pressures.  Await results of GXT.

## 2020-05-10 NOTE — Telephone Encounter (Signed)
-----   Message from Satira Sark, MD sent at 05/09/2020  4:36 PM EST ----- Results reviewed.  Please let her know that the stress test looked good, no ischemic change to suggest obstructive CAD as cause of her symptoms.  Would keep follow-up with PCP and pulmonologist.

## 2020-05-11 NOTE — Telephone Encounter (Signed)
Patient informed. Copy sent to PCP °

## 2020-05-24 ENCOUNTER — Other Ambulatory Visit: Payer: Self-pay | Admitting: Internal Medicine

## 2020-05-24 DIAGNOSIS — Z1231 Encounter for screening mammogram for malignant neoplasm of breast: Secondary | ICD-10-CM

## 2020-05-31 DIAGNOSIS — J45909 Unspecified asthma, uncomplicated: Secondary | ICD-10-CM | POA: Diagnosis not present

## 2020-05-31 DIAGNOSIS — R0600 Dyspnea, unspecified: Secondary | ICD-10-CM | POA: Diagnosis not present

## 2020-05-31 DIAGNOSIS — K219 Gastro-esophageal reflux disease without esophagitis: Secondary | ICD-10-CM | POA: Diagnosis not present

## 2020-05-31 DIAGNOSIS — J301 Allergic rhinitis due to pollen: Secondary | ICD-10-CM | POA: Diagnosis not present

## 2020-07-07 ENCOUNTER — Ambulatory Visit: Payer: Medicare Other

## 2020-08-17 ENCOUNTER — Ambulatory Visit
Admission: RE | Admit: 2020-08-17 | Discharge: 2020-08-17 | Disposition: A | Discharge status: Home / Self Care | Payer: Medicare Other | Source: Ambulatory Visit | Attending: Internal Medicine | Admitting: Internal Medicine

## 2020-08-17 ENCOUNTER — Other Ambulatory Visit: Payer: Self-pay

## 2020-08-17 DIAGNOSIS — Z1231 Encounter for screening mammogram for malignant neoplasm of breast: Secondary | ICD-10-CM | POA: Diagnosis not present

## 2020-09-26 DIAGNOSIS — E7800 Pure hypercholesterolemia, unspecified: Secondary | ICD-10-CM | POA: Diagnosis not present

## 2020-09-26 DIAGNOSIS — Z7189 Other specified counseling: Secondary | ICD-10-CM | POA: Diagnosis not present

## 2020-09-26 DIAGNOSIS — Z0000 Encounter for general adult medical examination without abnormal findings: Secondary | ICD-10-CM | POA: Diagnosis not present

## 2020-09-26 DIAGNOSIS — Z299 Encounter for prophylactic measures, unspecified: Secondary | ICD-10-CM | POA: Diagnosis not present

## 2020-09-26 DIAGNOSIS — Z79899 Other long term (current) drug therapy: Secondary | ICD-10-CM | POA: Diagnosis not present

## 2020-09-26 DIAGNOSIS — Z1339 Encounter for screening examination for other mental health and behavioral disorders: Secondary | ICD-10-CM | POA: Diagnosis not present

## 2020-09-26 DIAGNOSIS — Z6823 Body mass index (BMI) 23.0-23.9, adult: Secondary | ICD-10-CM | POA: Diagnosis not present

## 2020-09-26 DIAGNOSIS — Z1331 Encounter for screening for depression: Secondary | ICD-10-CM | POA: Diagnosis not present

## 2020-09-26 DIAGNOSIS — F419 Anxiety disorder, unspecified: Secondary | ICD-10-CM | POA: Diagnosis not present

## 2020-11-01 DIAGNOSIS — K219 Gastro-esophageal reflux disease without esophagitis: Secondary | ICD-10-CM | POA: Diagnosis not present

## 2020-11-01 DIAGNOSIS — R0600 Dyspnea, unspecified: Secondary | ICD-10-CM | POA: Diagnosis not present

## 2020-11-01 DIAGNOSIS — J4520 Mild intermittent asthma, uncomplicated: Secondary | ICD-10-CM | POA: Diagnosis not present

## 2020-11-01 DIAGNOSIS — J301 Allergic rhinitis due to pollen: Secondary | ICD-10-CM | POA: Diagnosis not present

## 2020-11-09 DIAGNOSIS — H2513 Age-related nuclear cataract, bilateral: Secondary | ICD-10-CM | POA: Diagnosis not present

## 2020-11-09 DIAGNOSIS — H43813 Vitreous degeneration, bilateral: Secondary | ICD-10-CM | POA: Diagnosis not present

## 2020-11-09 DIAGNOSIS — H401132 Primary open-angle glaucoma, bilateral, moderate stage: Secondary | ICD-10-CM | POA: Diagnosis not present

## 2020-11-30 DIAGNOSIS — M545 Low back pain, unspecified: Secondary | ICD-10-CM | POA: Diagnosis not present

## 2020-11-30 DIAGNOSIS — M5136 Other intervertebral disc degeneration, lumbar region: Secondary | ICD-10-CM | POA: Diagnosis not present

## 2020-11-30 DIAGNOSIS — Z299 Encounter for prophylactic measures, unspecified: Secondary | ICD-10-CM | POA: Diagnosis not present

## 2020-11-30 DIAGNOSIS — Z87891 Personal history of nicotine dependence: Secondary | ICD-10-CM | POA: Diagnosis not present

## 2020-12-05 DIAGNOSIS — Z1839 Other retained organic fragments: Secondary | ICD-10-CM | POA: Diagnosis not present

## 2020-12-05 DIAGNOSIS — S20459A Superficial foreign body of unspecified back wall of thorax, initial encounter: Secondary | ICD-10-CM | POA: Diagnosis not present

## 2020-12-08 DIAGNOSIS — M545 Low back pain, unspecified: Secondary | ICD-10-CM | POA: Diagnosis not present

## 2021-01-05 DIAGNOSIS — M47816 Spondylosis without myelopathy or radiculopathy, lumbar region: Secondary | ICD-10-CM | POA: Diagnosis not present

## 2021-01-05 DIAGNOSIS — M48061 Spinal stenosis, lumbar region without neurogenic claudication: Secondary | ICD-10-CM | POA: Diagnosis not present

## 2021-01-05 DIAGNOSIS — M5441 Lumbago with sciatica, right side: Secondary | ICD-10-CM | POA: Diagnosis not present

## 2021-01-05 DIAGNOSIS — M4316 Spondylolisthesis, lumbar region: Secondary | ICD-10-CM | POA: Diagnosis not present

## 2021-01-05 DIAGNOSIS — G8929 Other chronic pain: Secondary | ICD-10-CM | POA: Diagnosis not present

## 2021-01-05 DIAGNOSIS — M5136 Other intervertebral disc degeneration, lumbar region: Secondary | ICD-10-CM | POA: Diagnosis not present

## 2021-01-05 DIAGNOSIS — M5416 Radiculopathy, lumbar region: Secondary | ICD-10-CM | POA: Diagnosis not present

## 2021-01-11 DIAGNOSIS — M5441 Lumbago with sciatica, right side: Secondary | ICD-10-CM | POA: Diagnosis not present

## 2021-01-12 DIAGNOSIS — M5441 Lumbago with sciatica, right side: Secondary | ICD-10-CM | POA: Diagnosis not present

## 2021-01-18 DIAGNOSIS — M5441 Lumbago with sciatica, right side: Secondary | ICD-10-CM | POA: Diagnosis not present

## 2021-01-20 DIAGNOSIS — M5441 Lumbago with sciatica, right side: Secondary | ICD-10-CM | POA: Diagnosis not present

## 2021-01-24 DIAGNOSIS — M5441 Lumbago with sciatica, right side: Secondary | ICD-10-CM | POA: Diagnosis not present

## 2021-01-26 DIAGNOSIS — M5441 Lumbago with sciatica, right side: Secondary | ICD-10-CM | POA: Diagnosis not present

## 2021-01-30 DIAGNOSIS — M5441 Lumbago with sciatica, right side: Secondary | ICD-10-CM | POA: Diagnosis not present

## 2021-02-01 DIAGNOSIS — M5441 Lumbago with sciatica, right side: Secondary | ICD-10-CM | POA: Diagnosis not present

## 2021-02-08 DIAGNOSIS — M5441 Lumbago with sciatica, right side: Secondary | ICD-10-CM | POA: Diagnosis not present

## 2021-02-15 DIAGNOSIS — M5441 Lumbago with sciatica, right side: Secondary | ICD-10-CM | POA: Diagnosis not present

## 2021-02-23 DIAGNOSIS — M5441 Lumbago with sciatica, right side: Secondary | ICD-10-CM | POA: Diagnosis not present

## 2021-03-08 DIAGNOSIS — M5416 Radiculopathy, lumbar region: Secondary | ICD-10-CM | POA: Diagnosis not present

## 2021-03-08 DIAGNOSIS — M48061 Spinal stenosis, lumbar region without neurogenic claudication: Secondary | ICD-10-CM | POA: Diagnosis not present

## 2021-03-08 DIAGNOSIS — M47816 Spondylosis without myelopathy or radiculopathy, lumbar region: Secondary | ICD-10-CM | POA: Diagnosis not present

## 2021-03-08 DIAGNOSIS — M4316 Spondylolisthesis, lumbar region: Secondary | ICD-10-CM | POA: Diagnosis not present

## 2021-03-08 DIAGNOSIS — M5136 Other intervertebral disc degeneration, lumbar region: Secondary | ICD-10-CM | POA: Diagnosis not present

## 2021-03-08 DIAGNOSIS — M5441 Lumbago with sciatica, right side: Secondary | ICD-10-CM | POA: Diagnosis not present

## 2021-03-08 DIAGNOSIS — R030 Elevated blood-pressure reading, without diagnosis of hypertension: Secondary | ICD-10-CM | POA: Diagnosis not present

## 2021-03-13 DIAGNOSIS — H401132 Primary open-angle glaucoma, bilateral, moderate stage: Secondary | ICD-10-CM | POA: Diagnosis not present

## 2021-03-13 DIAGNOSIS — H2513 Age-related nuclear cataract, bilateral: Secondary | ICD-10-CM | POA: Diagnosis not present

## 2021-03-13 DIAGNOSIS — H43813 Vitreous degeneration, bilateral: Secondary | ICD-10-CM | POA: Diagnosis not present

## 2021-03-23 DIAGNOSIS — M5416 Radiculopathy, lumbar region: Secondary | ICD-10-CM | POA: Diagnosis not present

## 2021-04-03 DIAGNOSIS — Z23 Encounter for immunization: Secondary | ICD-10-CM | POA: Diagnosis not present

## 2021-04-28 DIAGNOSIS — M47816 Spondylosis without myelopathy or radiculopathy, lumbar region: Secondary | ICD-10-CM | POA: Diagnosis not present

## 2021-04-28 DIAGNOSIS — M5441 Lumbago with sciatica, right side: Secondary | ICD-10-CM | POA: Diagnosis not present

## 2021-04-28 DIAGNOSIS — M5416 Radiculopathy, lumbar region: Secondary | ICD-10-CM | POA: Diagnosis not present

## 2021-04-28 DIAGNOSIS — M48061 Spinal stenosis, lumbar region without neurogenic claudication: Secondary | ICD-10-CM | POA: Diagnosis not present

## 2021-04-28 DIAGNOSIS — M4316 Spondylolisthesis, lumbar region: Secondary | ICD-10-CM | POA: Diagnosis not present

## 2021-04-28 DIAGNOSIS — M5136 Other intervertebral disc degeneration, lumbar region: Secondary | ICD-10-CM | POA: Diagnosis not present

## 2021-05-04 DIAGNOSIS — J4540 Moderate persistent asthma, uncomplicated: Secondary | ICD-10-CM | POA: Diagnosis not present

## 2021-05-04 DIAGNOSIS — R0600 Dyspnea, unspecified: Secondary | ICD-10-CM | POA: Diagnosis not present

## 2021-06-19 DIAGNOSIS — H401132 Primary open-angle glaucoma, bilateral, moderate stage: Secondary | ICD-10-CM | POA: Diagnosis not present

## 2021-06-19 DIAGNOSIS — H00024 Hordeolum internum left upper eyelid: Secondary | ICD-10-CM | POA: Diagnosis not present

## 2021-06-19 DIAGNOSIS — H2513 Age-related nuclear cataract, bilateral: Secondary | ICD-10-CM | POA: Diagnosis not present

## 2021-07-11 DIAGNOSIS — H0014 Chalazion left upper eyelid: Secondary | ICD-10-CM | POA: Diagnosis not present

## 2021-07-11 DIAGNOSIS — H2513 Age-related nuclear cataract, bilateral: Secondary | ICD-10-CM | POA: Diagnosis not present

## 2021-07-11 DIAGNOSIS — H43813 Vitreous degeneration, bilateral: Secondary | ICD-10-CM | POA: Diagnosis not present

## 2021-07-11 DIAGNOSIS — H401132 Primary open-angle glaucoma, bilateral, moderate stage: Secondary | ICD-10-CM | POA: Diagnosis not present

## 2021-08-02 DIAGNOSIS — L908 Other atrophic disorders of skin: Secondary | ICD-10-CM | POA: Diagnosis not present

## 2021-08-02 DIAGNOSIS — H00019 Hordeolum externum unspecified eye, unspecified eyelid: Secondary | ICD-10-CM | POA: Diagnosis not present

## 2021-08-02 DIAGNOSIS — L818 Other specified disorders of pigmentation: Secondary | ICD-10-CM | POA: Diagnosis not present

## 2021-08-02 DIAGNOSIS — L718 Other rosacea: Secondary | ICD-10-CM | POA: Diagnosis not present

## 2021-09-27 ENCOUNTER — Other Ambulatory Visit: Payer: Self-pay | Admitting: Internal Medicine

## 2021-09-27 DIAGNOSIS — Z1231 Encounter for screening mammogram for malignant neoplasm of breast: Secondary | ICD-10-CM

## 2021-10-04 DIAGNOSIS — Z7189 Other specified counseling: Secondary | ICD-10-CM | POA: Diagnosis not present

## 2021-10-04 DIAGNOSIS — Z1331 Encounter for screening for depression: Secondary | ICD-10-CM | POA: Diagnosis not present

## 2021-10-04 DIAGNOSIS — R5383 Other fatigue: Secondary | ICD-10-CM | POA: Diagnosis not present

## 2021-10-04 DIAGNOSIS — Z6823 Body mass index (BMI) 23.0-23.9, adult: Secondary | ICD-10-CM | POA: Diagnosis not present

## 2021-10-04 DIAGNOSIS — Z0000 Encounter for general adult medical examination without abnormal findings: Secondary | ICD-10-CM | POA: Diagnosis not present

## 2021-10-04 DIAGNOSIS — Z789 Other specified health status: Secondary | ICD-10-CM | POA: Diagnosis not present

## 2021-10-04 DIAGNOSIS — E7800 Pure hypercholesterolemia, unspecified: Secondary | ICD-10-CM | POA: Diagnosis not present

## 2021-10-04 DIAGNOSIS — Z79899 Other long term (current) drug therapy: Secondary | ICD-10-CM | POA: Diagnosis not present

## 2021-10-04 DIAGNOSIS — Z1339 Encounter for screening examination for other mental health and behavioral disorders: Secondary | ICD-10-CM | POA: Diagnosis not present

## 2021-10-04 DIAGNOSIS — Z299 Encounter for prophylactic measures, unspecified: Secondary | ICD-10-CM | POA: Diagnosis not present

## 2021-10-05 ENCOUNTER — Ambulatory Visit
Admission: RE | Admit: 2021-10-05 | Discharge: 2021-10-05 | Disposition: A | Discharge status: Home / Self Care | Payer: Medicare Other | Source: Ambulatory Visit | Attending: Internal Medicine | Admitting: Internal Medicine

## 2021-10-05 DIAGNOSIS — Z1231 Encounter for screening mammogram for malignant neoplasm of breast: Secondary | ICD-10-CM

## 2021-10-24 DIAGNOSIS — M859 Disorder of bone density and structure, unspecified: Secondary | ICD-10-CM | POA: Diagnosis not present

## 2021-10-24 DIAGNOSIS — M818 Other osteoporosis without current pathological fracture: Secondary | ICD-10-CM | POA: Diagnosis not present

## 2021-10-24 DIAGNOSIS — E2839 Other primary ovarian failure: Secondary | ICD-10-CM | POA: Diagnosis not present

## 2021-10-24 DIAGNOSIS — Z79899 Other long term (current) drug therapy: Secondary | ICD-10-CM | POA: Diagnosis not present

## 2021-11-02 DIAGNOSIS — J4540 Moderate persistent asthma, uncomplicated: Secondary | ICD-10-CM | POA: Diagnosis not present

## 2021-11-02 DIAGNOSIS — R0609 Other forms of dyspnea: Secondary | ICD-10-CM | POA: Diagnosis not present

## 2021-11-14 DIAGNOSIS — H2513 Age-related nuclear cataract, bilateral: Secondary | ICD-10-CM | POA: Diagnosis not present

## 2021-11-14 DIAGNOSIS — H401132 Primary open-angle glaucoma, bilateral, moderate stage: Secondary | ICD-10-CM | POA: Diagnosis not present

## 2021-11-14 DIAGNOSIS — H43813 Vitreous degeneration, bilateral: Secondary | ICD-10-CM | POA: Diagnosis not present

## 2022-03-19 DIAGNOSIS — H2513 Age-related nuclear cataract, bilateral: Secondary | ICD-10-CM | POA: Diagnosis not present

## 2022-03-19 DIAGNOSIS — H401132 Primary open-angle glaucoma, bilateral, moderate stage: Secondary | ICD-10-CM | POA: Diagnosis not present

## 2022-03-19 DIAGNOSIS — H43813 Vitreous degeneration, bilateral: Secondary | ICD-10-CM | POA: Diagnosis not present

## 2022-03-28 DIAGNOSIS — Z23 Encounter for immunization: Secondary | ICD-10-CM | POA: Diagnosis not present

## 2022-07-26 DIAGNOSIS — L905 Scar conditions and fibrosis of skin: Secondary | ICD-10-CM | POA: Diagnosis not present

## 2022-07-26 DIAGNOSIS — L720 Epidermal cyst: Secondary | ICD-10-CM | POA: Diagnosis not present

## 2022-07-26 DIAGNOSIS — L718 Other rosacea: Secondary | ICD-10-CM | POA: Diagnosis not present

## 2022-07-26 DIAGNOSIS — I781 Nevus, non-neoplastic: Secondary | ICD-10-CM | POA: Diagnosis not present

## 2022-07-26 DIAGNOSIS — D485 Neoplasm of uncertain behavior of skin: Secondary | ICD-10-CM | POA: Diagnosis not present

## 2022-08-10 DIAGNOSIS — H43813 Vitreous degeneration, bilateral: Secondary | ICD-10-CM | POA: Diagnosis not present

## 2022-08-10 DIAGNOSIS — H2513 Age-related nuclear cataract, bilateral: Secondary | ICD-10-CM | POA: Diagnosis not present

## 2022-08-10 DIAGNOSIS — H401132 Primary open-angle glaucoma, bilateral, moderate stage: Secondary | ICD-10-CM | POA: Diagnosis not present

## 2022-08-24 ENCOUNTER — Other Ambulatory Visit: Payer: Self-pay | Admitting: Internal Medicine

## 2022-08-24 DIAGNOSIS — Z1231 Encounter for screening mammogram for malignant neoplasm of breast: Secondary | ICD-10-CM

## 2022-10-10 DIAGNOSIS — Z7189 Other specified counseling: Secondary | ICD-10-CM | POA: Diagnosis not present

## 2022-10-10 DIAGNOSIS — Z299 Encounter for prophylactic measures, unspecified: Secondary | ICD-10-CM | POA: Diagnosis not present

## 2022-10-10 DIAGNOSIS — R5383 Other fatigue: Secondary | ICD-10-CM | POA: Diagnosis not present

## 2022-10-10 DIAGNOSIS — Z79899 Other long term (current) drug therapy: Secondary | ICD-10-CM | POA: Diagnosis not present

## 2022-10-10 DIAGNOSIS — M8580 Other specified disorders of bone density and structure, unspecified site: Secondary | ICD-10-CM | POA: Diagnosis not present

## 2022-10-10 DIAGNOSIS — Z6827 Body mass index (BMI) 27.0-27.9, adult: Secondary | ICD-10-CM | POA: Diagnosis not present

## 2022-10-10 DIAGNOSIS — M25511 Pain in right shoulder: Secondary | ICD-10-CM | POA: Diagnosis not present

## 2022-10-10 DIAGNOSIS — E7800 Pure hypercholesterolemia, unspecified: Secondary | ICD-10-CM | POA: Diagnosis not present

## 2022-10-10 DIAGNOSIS — Z1331 Encounter for screening for depression: Secondary | ICD-10-CM | POA: Diagnosis not present

## 2022-10-10 DIAGNOSIS — Z0000 Encounter for general adult medical examination without abnormal findings: Secondary | ICD-10-CM | POA: Diagnosis not present

## 2022-10-10 DIAGNOSIS — Z1339 Encounter for screening examination for other mental health and behavioral disorders: Secondary | ICD-10-CM | POA: Diagnosis not present

## 2022-10-12 ENCOUNTER — Ambulatory Visit: Payer: Medicare Other

## 2022-10-24 ENCOUNTER — Ambulatory Visit
Admission: RE | Admit: 2022-10-24 | Discharge: 2022-10-24 | Disposition: A | Discharge status: Home / Self Care | Payer: Medicare HMO | Source: Ambulatory Visit | Attending: Internal Medicine | Admitting: Internal Medicine

## 2022-10-24 DIAGNOSIS — Z1231 Encounter for screening mammogram for malignant neoplasm of breast: Secondary | ICD-10-CM | POA: Diagnosis not present

## 2022-11-01 DIAGNOSIS — J4540 Moderate persistent asthma, uncomplicated: Secondary | ICD-10-CM | POA: Diagnosis not present

## 2022-11-01 DIAGNOSIS — R0609 Other forms of dyspnea: Secondary | ICD-10-CM | POA: Diagnosis not present

## 2022-12-19 DIAGNOSIS — H401132 Primary open-angle glaucoma, bilateral, moderate stage: Secondary | ICD-10-CM | POA: Diagnosis not present

## 2022-12-19 DIAGNOSIS — H2513 Age-related nuclear cataract, bilateral: Secondary | ICD-10-CM | POA: Diagnosis not present

## 2022-12-19 DIAGNOSIS — H43813 Vitreous degeneration, bilateral: Secondary | ICD-10-CM | POA: Diagnosis not present

## 2023-01-07 ENCOUNTER — Encounter (INDEPENDENT_AMBULATORY_CARE_PROVIDER_SITE_OTHER): Payer: Self-pay | Admitting: *Deleted

## 2023-02-05 ENCOUNTER — Telehealth (INDEPENDENT_AMBULATORY_CARE_PROVIDER_SITE_OTHER): Payer: Self-pay | Admitting: *Deleted

## 2023-02-05 NOTE — Telephone Encounter (Signed)
Referring MD/PCP: [employer]: Francine Graven [id number]                    [employer] [id number]  Best Phone Number: [phone]  Reason for the colonoscopy hx polyps - 7 yr TCS recall  Has patient had this procedure before?  Yes, 2017  If so, when, by whom and where?    Is there a family history of colon cancer?  no  Who?  What age when diagnosed?    Is patient diabetic? If yes, Type 1 or Type 2   no      Does patient have prosthetic heart valve or mechanical valve?  no  Do you have a pacemaker/defibrillator?  no  Has patient ever had endocarditis/atrial fibrillation? no  Has patient had joint replacement within last 12 months?  no  Is patient constipated or do they take laxatives? no  Does patient have a history of alcohol/drug use?  no  Does patient use oxygen? no  Have you had a stroke/heart attack last 6 mths? no  Do you take medicine for weight loss?  no  For female patients,: have you had a hysterectomy m=no                      are you post menopausal yes                      do you still have your menstrual cycle no  Do you take any blood-thinning medications such as: (aspirin, warfarin, Plavix, Aggrenox)  no  If yes we need the name, milligram, dosage and who is prescribing doctor   Medications: tylenol 500 mg as needed, albuterol 108 mcg inhale every 6 hours as needed, biotin 1000 mcg 3 times daily, budesonide 160/4.5 2 puffs 2 times daily, cetirizine 10 mg daily, citracal PS, cranberry 500 mg daily, D-Mannose 500 mg daily, diazepam 2 mg every 6 hours as needed, fexofenadine 180 mg daily, ibuprofen 200 mg as needed, loratadine 10 mg daily, multi vit daily, omeprazole 20 mg daily, tretinoin 0.1 % cream at bedtime as  needed, dorzolamide eye drops both eyes twice daily, latanoprost 0.005% eye drops 1 drop each eye at bedtime  Allergies: nkdas  Pharmacy: [facility]

## 2023-02-06 NOTE — Telephone Encounter (Signed)
Ok to schedule.  Room 2  Thanks,  Vista Lawman, MD Gastroenterology and Hepatology [facility]

## 2023-02-14 DIAGNOSIS — Z6828 Body mass index (BMI) 28.0-28.9, adult: Secondary | ICD-10-CM | POA: Diagnosis not present

## 2023-02-14 DIAGNOSIS — R42 Dizziness and giddiness: Secondary | ICD-10-CM | POA: Diagnosis not present

## 2023-02-14 DIAGNOSIS — Z0000 Encounter for general adult medical examination without abnormal findings: Secondary | ICD-10-CM | POA: Diagnosis not present

## 2023-02-14 DIAGNOSIS — Z299 Encounter for prophylactic measures, unspecified: Secondary | ICD-10-CM | POA: Diagnosis not present

## 2023-02-14 DIAGNOSIS — F419 Anxiety disorder, unspecified: Secondary | ICD-10-CM | POA: Diagnosis not present

## 2023-02-21 NOTE — Telephone Encounter (Signed)
Will call once get Oct schedule

## 2023-03-14 MED ORDER — NA SULFATE-K SULFATE-MG SULF 17.5-3.13-1.6 GM/177ML PO SOLN: ORAL | 0 refills | Status: AC

## 2023-03-14 NOTE — Telephone Encounter (Signed)
PA approved via [employer]. authorization #[id number], DOS: 04/12/2023 - 06/12/2023

## 2023-03-14 NOTE — Telephone Encounter (Signed)
Spoke with pt. Scheduled for 10/25. Aware will send instructions. Rx for prep sent to pharmacy.

## 2023-03-14 NOTE — Addendum Note (Signed)
Addended by: Armstead Peaks on: 03/14/2023 11:23 AM   Modules accepted: Orders

## 2023-03-19 NOTE — Telephone Encounter (Signed)
Questionnaire from recall, no referral needed  

## 2023-04-03 DIAGNOSIS — H2513 Age-related nuclear cataract, bilateral: Secondary | ICD-10-CM | POA: Diagnosis not present

## 2023-04-03 DIAGNOSIS — H401132 Primary open-angle glaucoma, bilateral, moderate stage: Secondary | ICD-10-CM | POA: Diagnosis not present

## 2023-04-12 ENCOUNTER — Ambulatory Visit (HOSPITAL_BASED_OUTPATIENT_CLINIC_OR_DEPARTMENT_OTHER): Payer: Medicare HMO | Admitting: Anesthesiology

## 2023-04-12 ENCOUNTER — Encounter (HOSPITAL_COMMUNITY)
Admission: RE | Disposition: A | Discharge status: Home / Self Care | Payer: Self-pay | Source: Home / Self Care | Attending: Gastroenterology

## 2023-04-12 ENCOUNTER — Other Ambulatory Visit: Payer: Self-pay

## 2023-04-12 ENCOUNTER — Ambulatory Visit (HOSPITAL_COMMUNITY): Payer: Medicare HMO | Admitting: Anesthesiology

## 2023-04-12 ENCOUNTER — Encounter (HOSPITAL_COMMUNITY): Payer: Self-pay

## 2023-04-12 ENCOUNTER — Ambulatory Visit (HOSPITAL_COMMUNITY)
Admission: RE | Admit: 2023-04-12 | Discharge: 2023-04-12 | Disposition: A | Discharge status: Home / Self Care | Payer: Medicare HMO | Attending: Gastroenterology | Admitting: Gastroenterology

## 2023-04-12 ENCOUNTER — Encounter (INDEPENDENT_AMBULATORY_CARE_PROVIDER_SITE_OTHER): Payer: Self-pay | Admitting: *Deleted

## 2023-04-12 DIAGNOSIS — D125 Benign neoplasm of sigmoid colon: Secondary | ICD-10-CM | POA: Diagnosis not present

## 2023-04-12 DIAGNOSIS — K5730 Diverticulosis of large intestine without perforation or abscess without bleeding: Secondary | ICD-10-CM | POA: Diagnosis not present

## 2023-04-12 DIAGNOSIS — K648 Other hemorrhoids: Secondary | ICD-10-CM | POA: Insufficient documentation

## 2023-04-12 DIAGNOSIS — Z860100 Personal history of colon polyps, unspecified: Secondary | ICD-10-CM

## 2023-04-12 DIAGNOSIS — K219 Gastro-esophageal reflux disease without esophagitis: Secondary | ICD-10-CM | POA: Diagnosis not present

## 2023-04-12 DIAGNOSIS — K644 Residual hemorrhoidal skin tags: Secondary | ICD-10-CM

## 2023-04-12 DIAGNOSIS — Z1211 Encounter for screening for malignant neoplasm of colon: Secondary | ICD-10-CM | POA: Diagnosis not present

## 2023-04-12 DIAGNOSIS — J45909 Unspecified asthma, uncomplicated: Secondary | ICD-10-CM | POA: Insufficient documentation

## 2023-04-12 DIAGNOSIS — K635 Polyp of colon: Secondary | ICD-10-CM | POA: Diagnosis not present

## 2023-04-12 DIAGNOSIS — D126 Benign neoplasm of colon, unspecified: Secondary | ICD-10-CM | POA: Diagnosis not present

## 2023-04-12 HISTORY — PX: COLONOSCOPY WITH PROPOFOL: SHX5780

## 2023-04-12 LAB — HM COLONOSCOPY

## 2023-04-12 SURGERY — COLONOSCOPY WITH PROPOFOL
Anesthesia: General

## 2023-04-12 MED ORDER — LIDOCAINE HCL (CARDIAC) PF 100 MG/5ML IV SOSY
PREFILLED_SYRINGE | INTRAVENOUS | Status: DC | PRN
  Administered 2023-04-12: 50 mg via INTRAVENOUS

## 2023-04-12 MED ORDER — PROPOFOL 500 MG/50ML IV EMUL
INTRAVENOUS | Status: DC | PRN
  Administered 2023-04-12: 150 ug/kg/min via INTRAVENOUS

## 2023-04-12 MED ORDER — LACTATED RINGERS IV SOLN: INTRAVENOUS | Status: DC | PRN

## 2023-04-12 MED ORDER — PROPOFOL 10 MG/ML IV BOLUS
INTRAVENOUS | Status: DC | PRN
  Administered 2023-04-12: 100 mg via INTRAVENOUS
  Administered 2023-04-12: 50 mg via INTRAVENOUS

## 2023-04-12 NOTE — Anesthesia Procedure Notes (Signed)
Date/Time: 04/12/2023 10:02 AM  Performed by: Julian Reil, CRNAPre-anesthesia Checklist: Patient identified, Emergency Drugs available, Suction available and Patient being monitored Patient Re-evaluated:Patient Re-evaluated prior to induction Oxygen Delivery Method: Nasal cannula Induction Type: IV induction Placement Confirmation: positive ETCO2

## 2023-04-12 NOTE — Anesthesia Postprocedure Evaluation (Signed)
Anesthesia Post Note  Patient: Mackenzie Craig  Procedure(s) Performed: COLONOSCOPY WITH PROPOFOL  Patient location during evaluation: PACU Anesthesia Type: General Level of consciousness: awake and alert Pain management: pain level controlled Vital Signs Assessment: post-procedure vital signs reviewed and stable Respiratory status: spontaneous breathing, nonlabored ventilation, respiratory function stable and patient connected to nasal cannula oxygen Cardiovascular status: blood pressure returned to baseline and stable Postop Assessment: no apparent nausea or vomiting Anesthetic complications: no   There were no known notable events for this encounter.   Last Vitals:  Vitals:   04/12/23 0843 04/12/23 1030  BP: (!) 140/79 (!) 103/52  Pulse: 74 70  Resp: 11 16  Temp: 36.7 C 36.9 C  SpO2: 98% 96%    Last Pain:  Vitals:   04/12/23 1030  TempSrc: Oral  PainSc: 0-No pain                 Benedict Kue L Jedd Schulenburg

## 2023-04-12 NOTE — Discharge Instructions (Signed)

## 2023-04-12 NOTE — Op Note (Signed)
[facility] Patient Name: Mackenzie Craig Procedure Date: 04/12/2023 9:50 AM MRN: [id number] Date of Birth: [birth year] Attending MD: Sanjuan Dame , MD, [id number] CSN: [id number] Age: 72 Admit Type: Outpatient Procedure:                Colonoscopy Indications:              High risk colon cancer surveillance: Personal                            history of colonic polyps Providers:                Sanjuan Dame, MD, Angelica Ran, Dyann Ruddle Referring MD:              Medicines:                Monitored Anesthesia Care Complications:            No immediate complications. Estimated Blood Loss:     Estimated blood loss: none. Procedure:                Pre-Anesthesia Assessment:                           - Prior to the procedure, a History and Physical                            was performed, and patient medications and                            allergies were reviewed. The patient's tolerance of                            previous anesthesia was also reviewed. The risks                            and benefits of the procedure and the sedation                            options and risks were discussed with the patient.                            All questions were answered, and informed consent                            was obtained. Prior Anticoagulants: The patient has                            taken no anticoagulant or antiplatelet agents. ASA                            Grade Assessment: II - A patient with mild systemic                            disease. After reviewing the risks and benefits,  the patient was deemed in satisfactory condition to                            undergo the procedure.                           After obtaining informed consent, the colonoscope                            was passed under direct vision. Throughout the                            procedure, the patient's blood pressure, pulse, and                             oxygen saturations were monitored continuously. The                            [id number]) scope was introduced through                            the anus and advanced to the the terminal ileum.                            The colonoscopy was performed without difficulty.                            The patient tolerated the procedure well. The                            quality of the bowel preparation was evaluated                            using the BBPS [facility] Bowel Preparation Scale)                            with scores of: Right Colon = 3, Transverse Colon =                            3 and Left Colon = 3 (entire mucosa seen well with                            no residual staining, small fragments of stool or                            opaque liquid). The total BBPS score equals 9. The                            terminal ileum, ileocecal valve, appendiceal                            orifice, and rectum were photographed. Scope In: 10:05:21 AM Scope Out: 10:26:00 AM Scope Withdrawal Time: 0 hours 16 minutes  30 seconds  Total Procedure Duration: 0 hours 20 minutes 39 seconds  Findings:      The perianal and digital rectal examinations were normal.      A 4 mm polyp was found in the sigmoid colon. The polyp was sessile. The       polyp was removed with a cold snare. Resection and retrieval were       complete.      Many diverticula were found in the left colon.      Non-bleeding external and internal hemorrhoids were found during       retroflexion. The hemorrhoids were small. Impression:               - One 4 mm polyp in the sigmoid colon, removed with                            a cold snare. Resected and retrieved.                           - Diverticulosis in the left colon.                           - Non-bleeding external and internal hemorrhoids. Moderate Sedation:      Per Anesthesia Care Recommendation:           - Patient has a contact number available for                             emergencies. The signs and symptoms of potential                            delayed complications were discussed with the                            patient. Return to normal activities tomorrow.                            Written discharge instructions were provided to the                            patient.                           - Resume previous diet.                           - Continue present medications.                           - Await pathology results.                           - No repeat colonoscopy due to current age (82                            years or older). Procedure Code(s):        --- Professional ---  54098, Colonoscopy, flexible; with removal of                            tumor(s), polyp(s), or other lesion(s) by snare                            technique Diagnosis Code(s):        --- Professional ---                           Z86.010, Personal history of colonic polyps                           D12.5, Benign neoplasm of sigmoid colon                           K64.8, Other hemorrhoids                           K57.30, Diverticulosis of large intestine without                            perforation or abscess without bleeding CPT copyright 2022 American Medical Association. All rights reserved. The codes documented in this report are preliminary and upon coder review may  be revised to meet current compliance requirements. Sanjuan Dame, MD Sanjuan Dame, MD 04/12/2023 10:33:35 AM This report has been signed electronically. Number of Addenda: 0

## 2023-04-12 NOTE — H&P (Signed)
Primary Care Physician:  Ignatius Specking, MD Primary Gastroenterologist:  Dr. Tasia Catchings  Pre-Procedure History & Physical: HPI:  Mackenzie Craig is a 72 y.o. female is here for a colonoscopy for colon cancer screening purposes.  Patient denies any family history of colorectal cancer.  No melena or hematochezia.  No abdominal pain or unintentional weight loss.  No change in bowel habits.  Overall feels well from a GI standpoint.  2017 colonoscopy Patient had single polyp removed and it is tubular adenoma.  Past Medical History:  Diagnosis Date   Asthma    GERD (gastroesophageal reflux disease)    Seasonal allergic rhinitis due to pollen     Past Surgical History:  Procedure Laterality Date   BREAST EXCISIONAL BIOPSY Bilateral    benign   COLONOSCOPY N/A 01/11/2016   Procedure: COLONOSCOPY;  Surgeon: Malissa Hippo, MD;  [facility];  Service: Endoscopy;  Laterality: N/A;  12:00   SLT LASER APPLICATION Left 03/12/2016   Procedure: SLT LASER APPLICATION;  Surgeon: Susa Simmonds, MD;  Location: [facility];  Service: Ophthalmology;  Laterality: Left;    Prior to Admission medications   Medication Sig Start Date End Date Taking? Authorizing Provider  acetaminophen (TYLENOL) 500 MG tablet Take 500 mg by mouth every 6 (six) hours as needed.   Yes [provider]  albuterol (VENTOLIN HFA) 108 (90 Base) MCG/ACT inhaler Inhale into the lungs every 6 (six) hours as needed for wheezing or shortness of breath.   Yes [provider]  Biotin 1000 MCG tablet Take 1,000 mcg by mouth 3 (three) times daily. Pt not taking   Yes [provider]  budesonide-formoterol (SYMBICORT) 160-4.5 MCG/ACT inhaler Inhale 2 puffs into the lungs 2 (two) times daily.   Yes [provider]  Calcium Citrate (CITRACAL PO) Take by mouth.   Yes [provider]  cetirizine (ZYRTEC) 10 MG tablet Take 10 mg by mouth daily as needed.   Yes [provider]  Cranberry 500 MG  CAPS Take by mouth.   Yes [provider]  D-Mannose 500 MG CAPS Take by mouth.   Yes [provider]  diazepam (VALIUM) 2 MG tablet Take 2 mg by mouth every 6 (six) hours as needed for anxiety.   Yes [provider]  dorzolamide (TRUSOPT) 2 % ophthalmic solution Place 1 drop into both eyes daily.   Yes [provider]  fexofenadine (ALLEGRA) 180 MG tablet Take 180 mg by mouth daily as needed.   Yes [provider]  ibuprofen (ADVIL) 200 MG tablet Take 200 mg by mouth every 6 (six) hours as needed.   Yes [provider]  latanoprost (XALATAN) 0.005 % ophthalmic solution Place 1 drop into both eyes at bedtime.   Yes [provider]  loratadine (CLARITIN) 10 MG tablet Take 10 mg by mouth daily as needed.   Yes [provider]  Na Sulfate-K Sulfate-Mg Sulf 17.5-3.13-1.6 GM/177ML SOLN As directed 03/14/23  Yes Kavina Cantave, Juanetta Beets, MD  omeprazole (PRILOSEC) 20 MG capsule Take 20 mg by mouth daily.   Yes [provider]  tretinoin (RETIN-A) 0.1 % cream Apply 1 application topically at bedtime as needed (ACNE).  10/24/15  Yes [provider]  Multiple Vitamin (MULTIVITAMIN WITH MINERALS) TABS tablet Take 1 tablet by mouth daily.    [provider]    Allergies as of 03/14/2023   (No Known Allergies)    Family History  Problem Relation Age of Onset  Rheum arthritis Mother    Diabetes Mellitus II Father    Transient ischemic attack Father    Stroke Sister     Social History   Socioeconomic History   Marital status: Divorced    Spouse name: Not on file   Number of children: Not on file   Years of education: Not on file   Highest education level: Not on file  Occupational History   Not on file  Tobacco Use   Smoking status: Never   Smokeless tobacco: Never  Substance and Sexual Activity   Alcohol use: No    Alcohol/week: 0.0 standard drinks of alcohol   Drug use: No   Sexual activity: Not on  file  Other Topics Concern   Not on file  Social History Narrative   Not on file   Social Determinants of Health   Financial Resource Strain: Not on file  Food Insecurity: Not on file  Transportation Needs: Not on file  Physical Activity: Not on file  Stress: Not on file  Social Connections: Not on file  Intimate Partner Violence: Not on file    Review of Systems: See HPI, otherwise negative ROS  Physical Exam: Vital signs in last 24 hours: Temp:  [98.1 F (36.7 C)] 98.1 F (36.7 C) (10/25 0843) Pulse Rate:  [74] 74 (10/25 0843) Resp:  [11] 11 (10/25 0843) BP: (140)/(79) 140/79 (10/25 0843) SpO2:  [98 %] 98 % (10/25 0843) Weight:  [75.8 kg] 75.8 kg (10/25 0843)   General:   Alert,  Well-developed, well-nourished, pleasant and cooperative in NAD Head:  Normocephalic and atraumatic. Eyes:  Sclera clear, no icterus.   Conjunctiva pink. Ears:  Normal auditory acuity. Nose:  No deformity, discharge,  or lesions. Msk:  Symmetrical without gross deformities. Normal posture. Extremities:  Without clubbing or edema. Neurologic:  Alert and  oriented x4;  grossly normal neurologically. Skin:  Intact without significant lesions or rashes. Psych:  Alert and cooperative. Normal mood and affect.  Impression/Plan: Mackenzie Craig is here for a colonoscopy to be performed for colon cancer screening purposes.  The risks of the procedure including infection, bleed, or perforation as well as benefits, limitations, alternatives and imponderables have been reviewed with the patient. Questions have been answered. All parties agreeable.

## 2023-04-12 NOTE — Anesthesia Preprocedure Evaluation (Addendum)
Anesthesia Evaluation  Patient identified by MRN, date of birth, ID band Patient awake    Reviewed: Allergy & Precautions, H&P , NPO status , Patient's Chart, lab work & pertinent test results, reviewed documented beta blocker date and time   Airway Mallampati: II  TM Distance: >3 FB Neck ROM: full    Dental no notable dental hx. (+) Dental Advisory Given, Teeth Intact, Caps,    Pulmonary asthma    Pulmonary exam normal breath sounds clear to auscultation       Cardiovascular Exercise Tolerance: Good negative cardio ROS Normal cardiovascular exam Rhythm:regular Rate:Normal     Neuro/Psych negative neurological ROS  negative psych ROS   GI/Hepatic Neg liver ROS,GERD  ,,  Endo/Other  negative endocrine ROS    Renal/GU negative Renal ROS  negative genitourinary   Musculoskeletal   Abdominal   Peds  Hematology negative hematology ROS (+)   Anesthesia Other Findings   Reproductive/Obstetrics negative OB ROS                             Anesthesia Physical Anesthesia Plan  ASA: 2  Anesthesia Plan: General   Post-op Pain Management: Minimal or no pain anticipated   Induction:   PONV Risk Score and Plan:   Airway Management Planned: Nasal Cannula and Natural Airway  Additional Equipment: None  Intra-op Plan:   Post-operative Plan:   Informed Consent: I have reviewed the patients History and Physical, chart, labs and discussed the procedure including the risks, benefits and alternatives for the proposed anesthesia with the patient or authorized representative who has indicated his/her understanding and acceptance.     Dental advisory given  Plan Discussed with: CRNA  Anesthesia Plan Comments:         Anesthesia Quick Evaluation

## 2023-04-12 NOTE — Transfer of Care (Signed)
Immediate Anesthesia Transfer of Care Note  Patient: Mackenzie Craig  Procedure(s) Performed: COLONOSCOPY WITH PROPOFOL  Patient Location: Endoscopy Unit  Anesthesia Type:General  Level of Consciousness: awake, alert , and oriented  Airway & Oxygen Therapy: Patient Spontanous Breathing  Post-op Assessment: Report given to RN and Post -op Vital signs reviewed and stable  Post vital signs: Reviewed and stable  Last Vitals:  Vitals Value Taken Time  BP 103/52 04/12/23 1030  Temp 36.9 C 04/12/23 1030  Pulse 70 04/12/23 1030  Resp 16 04/12/23 1030  SpO2 96 % 04/12/23 1030    Last Pain:  Vitals:   04/12/23 1030  TempSrc: Oral  PainSc: 0-No pain      Patients Stated Pain Goal: 6 (04/12/23 0843)  Complications: No notable events documented.

## 2023-04-16 LAB — SURGICAL PATHOLOGY

## 2023-04-16 NOTE — Progress Notes (Signed)
I reviewed the pathology results. Ann, can you send her a letter with the findings as described below please? Repeat colonoscopy in 7 years  Thanks,  Vista Lawman, MD Gastroenterology and Hepatology [facility]  ---------------------------------------------------------------------------------------------  [facility] [address], [address], [place], [place] [address] Phone:  [phone]   04/16/23 [name], [place]   Dear Mackenzie Craig,  I am writing to inform you that the biopsies taken during your recent endoscopic examination showed:  I am writing to let you know the results of your recent colonoscopy.  You had a total of 1 polyps removed. 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  Given these findings, it is recommended that your next colonoscopy be performed in 7 years, but you between age 75-85 decision to pursue colonoscopy is individualized.   Please call us at [phone] if you have persistent problems or have questions about your condition that have not been fully answered at this time.  Sincerely,  Vista Lawman, MD Gastroenterology and Hepatology

## 2023-04-18 ENCOUNTER — Encounter (HOSPITAL_COMMUNITY): Payer: Self-pay | Admitting: Gastroenterology

## 2023-04-23 ENCOUNTER — Encounter (INDEPENDENT_AMBULATORY_CARE_PROVIDER_SITE_OTHER): Payer: Self-pay | Admitting: *Deleted

## 2023-04-26 DIAGNOSIS — Z0100 Encounter for examination of eyes and vision without abnormal findings: Secondary | ICD-10-CM | POA: Diagnosis not present

## 2023-05-20 DIAGNOSIS — H9313 Tinnitus, bilateral: Secondary | ICD-10-CM | POA: Diagnosis not present

## 2023-05-20 DIAGNOSIS — H903 Sensorineural hearing loss, bilateral: Secondary | ICD-10-CM | POA: Diagnosis not present

## 2023-05-20 DIAGNOSIS — H90A32 Mixed conductive and sensorineural hearing loss, unilateral, left ear with restricted hearing on the contralateral side: Secondary | ICD-10-CM | POA: Diagnosis not present

## 2023-05-20 DIAGNOSIS — H6992 Unspecified Eustachian tube disorder, left ear: Secondary | ICD-10-CM | POA: Diagnosis not present

## 2023-06-05 DIAGNOSIS — H7292 Unspecified perforation of tympanic membrane, left ear: Secondary | ICD-10-CM | POA: Diagnosis not present

## 2023-06-13 DIAGNOSIS — H7292 Unspecified perforation of tympanic membrane, left ear: Secondary | ICD-10-CM | POA: Diagnosis not present

## 2023-06-13 DIAGNOSIS — Z299 Encounter for prophylactic measures, unspecified: Secondary | ICD-10-CM | POA: Diagnosis not present

## 2023-06-13 DIAGNOSIS — F419 Anxiety disorder, unspecified: Secondary | ICD-10-CM | POA: Diagnosis not present

## 2023-07-12 ENCOUNTER — Telehealth (INDEPENDENT_AMBULATORY_CARE_PROVIDER_SITE_OTHER): Payer: Self-pay | Admitting: Otolaryngology

## 2023-07-12 NOTE — Telephone Encounter (Signed)
LVM to confirm appt & location [id number] afm

## 2023-07-15 ENCOUNTER — Ambulatory Visit (INDEPENDENT_AMBULATORY_CARE_PROVIDER_SITE_OTHER): Payer: Medicare HMO

## 2023-07-15 VITALS — BP 164/80 | HR 75 | Ht 66.0 in | Wt 160.0 lb

## 2023-07-15 DIAGNOSIS — H7202 Central perforation of tympanic membrane, left ear: Secondary | ICD-10-CM

## 2023-07-15 DIAGNOSIS — H6122 Impacted cerumen, left ear: Secondary | ICD-10-CM | POA: Diagnosis not present

## 2023-07-15 DIAGNOSIS — H7201 Central perforation of tympanic membrane, right ear: Secondary | ICD-10-CM

## 2023-07-15 DIAGNOSIS — H9012 Conductive hearing loss, unilateral, left ear, with unrestricted hearing on the contralateral side: Secondary | ICD-10-CM

## 2023-07-17 DIAGNOSIS — H7202 Central perforation of tympanic membrane, left ear: Secondary | ICD-10-CM | POA: Insufficient documentation

## 2023-07-17 DIAGNOSIS — H9012 Conductive hearing loss, unilateral, left ear, with unrestricted hearing on the contralateral side: Secondary | ICD-10-CM | POA: Insufficient documentation

## 2023-07-17 DIAGNOSIS — H6122 Impacted cerumen, left ear: Secondary | ICD-10-CM | POA: Insufficient documentation

## 2023-07-17 NOTE — Progress Notes (Signed)
Patient ID: Mackenzie Craig, female   DOB: [birth year], 73 y.o.   MRN: [id number]  CC: Left ear hearing loss, left tympanic membrane perforation  HPI:  Mackenzie Craig is a 73 y.o. female who presents today complaining of decreased left ear hearing for the past 2 months.  She was evaluated at [facility] at [facility].  She was noted to have left ear conductive hearing loss.  She was also diagnosed with a left tympanic membrane perforation.  The patient has a history of recurrent childhood otitis media.  However, she has not noted any recent infection.  She underwent right ear tube placement as an adult to treat her right middle ear effusion.  The tube has since extruded.  She denies any left ear ventilating tube placement.  Currently she denies any otalgia, otorrhea, or vertigo.  She has no other ENT surgery.  Past Medical History:  Diagnosis Date   Asthma    GERD (gastroesophageal reflux disease)    Seasonal allergic rhinitis due to pollen     Past Surgical History:  Procedure Laterality Date   BREAST EXCISIONAL BIOPSY Bilateral    benign   COLONOSCOPY N/A 01/11/2016   Procedure: COLONOSCOPY;  Surgeon: Malissa Hippo, MD;  [facility];  Service: Endoscopy;  Laterality: N/A;  12:00   COLONOSCOPY WITH PROPOFOL N/A 04/12/2023   Procedure: COLONOSCOPY WITH PROPOFOL;  Surgeon: Franky Macho, MD;  Location: [facility];  Service: Endoscopy;  Laterality: N/A;  1030AM, ASA 2   SLT LASER APPLICATION Left 03/12/2016   Procedure: SLT LASER APPLICATION;  Surgeon: Susa Simmonds, MD;  Location: [facility] ORS;  Service: Ophthalmology;  Laterality: Left;    Family History  Problem Relation Age of Onset   Rheum arthritis Mother    Diabetes Mellitus II Father    Transient ischemic attack Father    Stroke Sister     Social History:  reports that she has never smoked. She has never used smokeless tobacco. She reports that she does not drink alcohol and does not use drugs.  Allergies: No  Known Allergies  Prior to Admission medications   Medication Sig Start Date End Date Taking? Authorizing Provider  Biotin 1000 MCG tablet Take 1,000 mcg by mouth 3 (three) times daily. Pt not taking   Yes [provider]  budesonide-formoterol (SYMBICORT) 160-4.5 MCG/ACT inhaler Inhale 2 puffs into the lungs 2 (two) times daily.   Yes [provider]  Calcium Citrate (CITRACAL PO) Take by mouth.   Yes [provider]  Cranberry 500 MG CAPS Take by mouth.   Yes [provider]  dorzolamide (TRUSOPT) 2 % ophthalmic solution Place 1 drop into both eyes daily.   Yes [provider]  latanoprost (XALATAN) 0.005 % ophthalmic solution Place 1 drop into both eyes at bedtime.   Yes [provider]  Multiple Vitamin (MULTIVITAMIN WITH MINERALS) TABS tablet Take 1 tablet by mouth daily.   Yes [provider]  omeprazole (PRILOSEC) 20 MG capsule Take 20 mg by mouth daily.   Yes [provider]  tretinoin (RETIN-A) 0.1 % cream Apply 1 application topically at bedtime as needed (ACNE).  10/24/15  Yes [provider]  acetaminophen (TYLENOL) 500 MG tablet Take 500 mg by mouth every 6 (six) hours as needed. Patient not taking: Reported on 07/15/2023    [provider]  albuterol (VENTOLIN HFA) 108 (90 Base) MCG/ACT inhaler Inhale into the lungs every 6 (six) hours as needed  for wheezing or shortness of breath. Patient not taking: Reported on 07/15/2023    [provider]  cetirizine (ZYRTEC) 10 MG tablet Take 10 mg by mouth daily as needed. Patient not taking: Reported on 07/15/2023    [provider]  D-Mannose 500 MG CAPS Take by mouth. Patient not taking: Reported on 07/15/2023    [provider]  diazepam (VALIUM) 2 MG tablet Take 2 mg by mouth every 6 (six) hours as needed for anxiety. Patient not taking: Reported on 07/15/2023    [provider]  fexofenadine (ALLEGRA) 180 MG tablet  Take 180 mg by mouth daily as needed. Patient not taking: Reported on 07/15/2023    [provider]  ibuprofen (ADVIL) 200 MG tablet Take 200 mg by mouth every 6 (six) hours as needed. Patient not taking: Reported on 07/15/2023    [provider]  loratadine (CLARITIN) 10 MG tablet Take 10 mg by mouth daily as needed. Patient not taking: Reported on 07/15/2023    [provider]  Na Sulfate-K Sulfate-Mg Sulf 17.5-3.13-1.6 GM/177ML SOLN As directed 03/14/23   Ahmed, Juanetta Beets, MD    Blood pressure (!) 164/80, pulse 75, height 5\' 6"  (1.676 m), weight 160 lb (72.6 kg), SpO2 93%. Exam: General: Communicates without difficulty, well nourished, no acute distress. Head: Normocephalic, no evidence injury, no tenderness, facial buttresses intact without stepoff. Face/sinus: No tenderness to palpation and percussion. Facial movement is normal and symmetric. Eyes: PERRL, EOMI. No scleral icterus, conjunctivae clear. Neuro: CN II exam reveals vision grossly intact.  No nystagmus at any point of gaze. Ears: Auricles well formed without lesions.  Left ear cerumen impaction.  The right ear canal and tympanic membrane are normal.  Nose: External evaluation reveals normal support and skin without lesions.  Dorsum is intact.  Anterior rhinoscopy reveals congested mucosa over anterior aspect of inferior turbinates and intact septum.  No purulence noted. Oral:  Oral cavity and oropharynx are intact, symmetric, without erythema or edema.  Mucosa is moist without lesions. Neck: Full range of motion without pain.  There is no significant lymphadenopathy.  No masses palpable.  Thyroid bed within normal limits to palpation.  Parotid glands and submandibular glands equal bilaterally without mass.  Trachea is midline. Neuro:  CN 2-12 grossly intact.   Procedure: Left ear cerumen disimpaction Anesthesia: None Description: Under the operating microscope, the cerumen is carefully removed with a combination  of cerumen currette, alligator forceps, and suction catheters.  After the cerumen is removed, a 50% left inferior tympanic membrane perforation is noted.  No mass, erythema, or lesions. The patient tolerated the procedure well.    Assessment: 1.  Left ear cerumen impaction.  After the cerumen removal procedure, a 50% left inferior tympanic membrane perforation is noted. 2.  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  Her hearing test was performed in [place], [place]. 3.  No acute infection is noted today.  Plan: 1.  Otomicroscopy with left ear cerumen disimpaction. 2.  The physical exam findings are reviewed with the patient. 3.  Dry ear precautions on the left side. 4.  The treatment options are extensively discussed.  The options include conservative observation, use of left ear hearing aid, or left tympanoplasty surgery to close the perforation.  The risk, benefits, alternatives, and details of the treatment options are extensively discussed.  Questions are invited and answered. 5.  The patient is interested in proceeding with a left tympanoplasty procedure.  The  repair will be performed with a temporalis fascia graft. 6.  We will need to obtain her audiogram from [facility] in [place].  Adrieana Fennelly W Linwood Gullikson 07/17/2023, 9:10 AM

## 2023-07-23 ENCOUNTER — Encounter (HOSPITAL_BASED_OUTPATIENT_CLINIC_OR_DEPARTMENT_OTHER): Payer: Self-pay | Admitting: Otolaryngology

## 2023-07-23 ENCOUNTER — Other Ambulatory Visit: Payer: Self-pay

## 2023-07-29 ENCOUNTER — Encounter (HOSPITAL_BASED_OUTPATIENT_CLINIC_OR_DEPARTMENT_OTHER): Payer: Self-pay | Admitting: Otolaryngology

## 2023-07-29 ENCOUNTER — Ambulatory Visit (HOSPITAL_BASED_OUTPATIENT_CLINIC_OR_DEPARTMENT_OTHER): Payer: Medicare HMO | Admitting: Anesthesiology

## 2023-07-29 ENCOUNTER — Ambulatory Visit (HOSPITAL_BASED_OUTPATIENT_CLINIC_OR_DEPARTMENT_OTHER)
Admission: RE | Admit: 2023-07-29 | Discharge: 2023-07-29 | Disposition: A | Discharge status: Home / Self Care | Payer: Medicare HMO | Attending: Otolaryngology | Admitting: Otolaryngology

## 2023-07-29 ENCOUNTER — Other Ambulatory Visit: Payer: Self-pay

## 2023-07-29 ENCOUNTER — Encounter (HOSPITAL_BASED_OUTPATIENT_CLINIC_OR_DEPARTMENT_OTHER)
Admission: RE | Disposition: A | Discharge status: Home / Self Care | Payer: Self-pay | Source: Home / Self Care | Attending: Otolaryngology

## 2023-07-29 DIAGNOSIS — J45909 Unspecified asthma, uncomplicated: Secondary | ICD-10-CM | POA: Insufficient documentation

## 2023-07-29 DIAGNOSIS — H902 Conductive hearing loss, unspecified: Secondary | ICD-10-CM | POA: Insufficient documentation

## 2023-07-29 DIAGNOSIS — H7292 Unspecified perforation of tympanic membrane, left ear: Secondary | ICD-10-CM

## 2023-07-29 DIAGNOSIS — K219 Gastro-esophageal reflux disease without esophagitis: Secondary | ICD-10-CM | POA: Diagnosis not present

## 2023-07-29 DIAGNOSIS — H7202 Central perforation of tympanic membrane, left ear: Secondary | ICD-10-CM

## 2023-07-29 DIAGNOSIS — Z7951 Long term (current) use of inhaled steroids: Secondary | ICD-10-CM | POA: Insufficient documentation

## 2023-07-29 DIAGNOSIS — H6122 Impacted cerumen, left ear: Secondary | ICD-10-CM | POA: Insufficient documentation

## 2023-07-29 DIAGNOSIS — H9012 Conductive hearing loss, unilateral, left ear, with unrestricted hearing on the contralateral side: Secondary | ICD-10-CM

## 2023-07-29 HISTORY — PX: TYMPANOPLASTY WITH GRAFT: SHX6567

## 2023-07-29 SURGERY — TYMPANOPLASTY, USING GRAFT
Anesthesia: General | Site: Ear | Laterality: Left

## 2023-07-29 MED ORDER — DEXAMETHASONE SODIUM PHOSPHATE 10 MG/ML IJ SOLN
INTRAMUSCULAR | Status: AC
  Filled 2023-07-29: qty 1

## 2023-07-29 MED ORDER — ONDANSETRON HCL 4 MG/2ML IJ SOLN
INTRAMUSCULAR | Status: DC | PRN
  Administered 2023-07-29: 4 mg via INTRAVENOUS

## 2023-07-29 MED ORDER — LIDOCAINE 2% (20 MG/ML) 5 ML SYRINGE
INTRAMUSCULAR | Status: DC | PRN
  Administered 2023-07-29: 60 mg via INTRAVENOUS

## 2023-07-29 MED ORDER — EPHEDRINE SULFATE-NACL 50-0.9 MG/10ML-% IV SOSY
PREFILLED_SYRINGE | INTRAVENOUS | Status: DC | PRN
  Administered 2023-07-29: 5 mg via INTRAVENOUS

## 2023-07-29 MED ORDER — DEXAMETHASONE SODIUM PHOSPHATE 4 MG/ML IJ SOLN
INTRAMUSCULAR | Status: DC | PRN
  Administered 2023-07-29: 10 mg via INTRAVENOUS

## 2023-07-29 MED ORDER — ACETAMINOPHEN 500 MG PO TABS: 1000.0000 mg | ORAL_TABLET | Freq: Once | ORAL | Status: DC | PRN

## 2023-07-29 MED ORDER — FENTANYL CITRATE (PF) 100 MCG/2ML IJ SOLN
INTRAMUSCULAR | Status: AC
  Filled 2023-07-29: qty 2

## 2023-07-29 MED ORDER — LIDOCAINE-EPINEPHRINE 1 %-1:100000 IJ SOLN
INTRAMUSCULAR | Status: DC | PRN
  Administered 2023-07-29: 3 mL

## 2023-07-29 MED ORDER — CEFAZOLIN SODIUM-DEXTROSE 2-4 GM/100ML-% IV SOLN
2.0000 g | Freq: Once | INTRAVENOUS | Status: AC
  Administered 2023-07-29: 2 g via INTRAVENOUS

## 2023-07-29 MED ORDER — PHENYLEPHRINE 80 MCG/ML (10ML) SYRINGE FOR IV PUSH (FOR BLOOD PRESSURE SUPPORT)
PREFILLED_SYRINGE | INTRAVENOUS | Status: DC | PRN
  Administered 2023-07-29 (×3): 80 ug via INTRAVENOUS

## 2023-07-29 MED ORDER — OXYMETAZOLINE HCL 0.05 % NA SOLN
NASAL | Status: DC | PRN
  Administered 2023-07-29: 1

## 2023-07-29 MED ORDER — ACETAMINOPHEN 10 MG/ML IV SOLN: 1000.0000 mg | Freq: Once | INTRAVENOUS | Status: DC | PRN

## 2023-07-29 MED ORDER — FENTANYL CITRATE (PF) 100 MCG/2ML IJ SOLN
INTRAMUSCULAR | Status: DC | PRN
  Administered 2023-07-29 (×2): 25 ug via INTRAVENOUS
  Administered 2023-07-29: 50 ug via INTRAVENOUS

## 2023-07-29 MED ORDER — OXYCODONE HCL 5 MG/5ML PO SOLN
5.0000 mg | Freq: Once | ORAL | Status: AC | PRN
Start: 2023-07-29 — End: 2023-07-29

## 2023-07-29 MED ORDER — OXYCODONE HCL 5 MG PO TABS
5.0000 mg | ORAL_TABLET | Freq: Once | ORAL | Status: AC | PRN
  Administered 2023-07-29: 5 mg via ORAL

## 2023-07-29 MED ORDER — ACETAMINOPHEN 160 MG/5ML PO SOLN: 1000.0000 mg | Freq: Once | ORAL | Status: DC | PRN

## 2023-07-29 MED ORDER — FENTANYL CITRATE (PF) 100 MCG/2ML IJ SOLN: 25.0000 ug | INTRAMUSCULAR | Status: DC | PRN

## 2023-07-29 MED ORDER — PROPOFOL 10 MG/ML IV BOLUS
INTRAVENOUS | Status: DC | PRN
  Administered 2023-07-29: 130 mg via INTRAVENOUS

## 2023-07-29 MED ORDER — LACTATED RINGERS IV SOLN: INTRAVENOUS | Status: DC

## 2023-07-29 MED ORDER — OXYCODONE HCL 5 MG PO TABS
ORAL_TABLET | ORAL | Status: AC
Start: 2023-07-29 — End: ?
  Filled 2023-07-29: qty 1

## 2023-07-29 MED ORDER — CEFAZOLIN SODIUM 1 G IJ SOLR
INTRAMUSCULAR | Status: AC
  Filled 2023-07-29: qty 20

## 2023-07-29 MED ORDER — CIPROFLOXACIN-DEXAMETHASONE 0.3-0.1 % OT SUSP
OTIC | Status: DC | PRN
  Administered 2023-07-29: 4 [drp] via OTIC

## 2023-07-29 MED ORDER — ONDANSETRON HCL 4 MG/2ML IJ SOLN
INTRAMUSCULAR | Status: AC
  Filled 2023-07-29: qty 2

## 2023-07-29 SURGICAL SUPPLY — 42 items
BLADE CLIPPER SURG (BLADE) IMPLANT
BLADE NDL 3 SS STRL (BLADE) IMPLANT
BLADE NEEDLE 3 SS STRL (BLADE)
CANISTER SUCT 1200ML W/VALVE (MISCELLANEOUS) ×1 IMPLANT
CORD BIPOLAR FORCEPS 12FT (ELECTRODE) IMPLANT
COTTONBALL LRG STERILE PKG (GAUZE/BANDAGES/DRESSINGS) ×1 IMPLANT
DERMABOND ADVANCED .7 DNX12 (GAUZE/BANDAGES/DRESSINGS) IMPLANT
DRAPE MICROSCOPE WILD 40.5X102 (DRAPES) ×1 IMPLANT
DRAPE SURG 17X23 STRL (DRAPES) ×1 IMPLANT
DRSG GLASSCOCK MASTOID ADT (GAUZE/BANDAGES/DRESSINGS) IMPLANT
DRSG GLASSCOCK MASTOID PED (GAUZE/BANDAGES/DRESSINGS) IMPLANT
ELECT COATED BLADE 2.86 ST (ELECTRODE) ×1 IMPLANT
ELECT REM PT RETURN 9FT ADLT (ELECTROSURGICAL) ×1
ELECTRODE REM PT RTRN 9FT ADLT (ELECTROSURGICAL) ×1 IMPLANT
GAUZE SPONGE 4X4 12PLY STRL (GAUZE/BANDAGES/DRESSINGS) IMPLANT
GAUZE SPONGE 4X4 12PLY STRL LF (GAUZE/BANDAGES/DRESSINGS) IMPLANT
GLOVE BIO SURGEON STRL SZ7.5 (GLOVE) ×1 IMPLANT
GOWN STRL REUS W/ TWL LRG LVL3 (GOWN DISPOSABLE) ×2 IMPLANT
IV CATH AUTO 14GX1.75 SAFE ORG (IV SOLUTION) ×1 IMPLANT
IV NS 500ML BAXH (IV SOLUTION) IMPLANT
IV SET EXT 30 76VOL 4 MALE LL (IV SETS) ×1 IMPLANT
NDL HYPO 25X1 1.5 SAFETY (NEEDLE) ×1 IMPLANT
NDL SAFETY ECLIPSE 18X1.5 (NEEDLE) ×1 IMPLANT
NEEDLE HYPO 25X1 1.5 SAFETY (NEEDLE) ×1
NS IRRIG 1000ML POUR BTL (IV SOLUTION) ×1 IMPLANT
PACK BASIN DAY SURGERY FS (CUSTOM PROCEDURE TRAY) ×1 IMPLANT
PACK ENT DAY SURGERY (CUSTOM PROCEDURE TRAY) ×1 IMPLANT
PENCIL SMOKE EVACUATOR (MISCELLANEOUS) ×1 IMPLANT
SLEEVE IRRIGATION ELITE 7 (MISCELLANEOUS) IMPLANT
SLEEVE SCD COMPRESS KNEE MED (STOCKING) IMPLANT
SOL PREP POV-IOD 4OZ 10% (MISCELLANEOUS) IMPLANT
SPIKE FLUID TRANSFER (MISCELLANEOUS) ×1 IMPLANT
SPONGE SURGIFOAM ABS GEL 12-7 (HEMOSTASIS) ×1 IMPLANT
SUT VIC AB 3-0 SH 27X BRD (SUTURE) IMPLANT
SUT VIC AB 4-0 P-3 18XBRD (SUTURE) IMPLANT
SUT VIC AB 4-0 PS2 18 (SUTURE) IMPLANT
SYR 3ML 18GX1 1/2 (SYRINGE) ×1 IMPLANT
SYR 5ML LL (SYRINGE) IMPLANT
SYR BULB EAR ULCER 3OZ GRN STR (SYRINGE) IMPLANT
TOWEL GREEN STERILE FF (TOWEL DISPOSABLE) ×1 IMPLANT
TRAY DSU PREP LF (CUSTOM PROCEDURE TRAY) ×1 IMPLANT
TUBING IRRIGATION (MISCELLANEOUS) IMPLANT

## 2023-07-29 NOTE — Discharge Instructions (Addendum)
 POSTOPERATIVE INSTRUCTIONS FOR PATIENTS HAVING A MYRINGOPLASTY AND TYMPANOPLASTY Avoid undue fatigue or exposure to colds or upper respiratory infections if possible. Do not blow your nose for approximately one week following surgery. Any accumulated secretions in the nose should be drawn back and expectorated through the mouth to avoid infecting the ear. If you sneeze, do so with your mouth open. Do not hold your nose to avoid sneezing. Do not play musical wind instruments for 3 weeks. Wash your hands with soap and water  before treating the ear. A clean cloth moistened with warm water  may be used to clean the outer ear as often as necessary for cleanliness and comfort. Do not allow water  to enter the ear canal for at least three weeks. You may shampoo your hair 48 hours following surgery, provided that water  is not allowed to enter your ear canal. Water  can be kept out of your ear canal by placing a cotton ball in the ear opening and applying Vaseline over the cotton to form a water  tight seal. If ear drops are to be instilled, position the head with the affected ear up during the instillation and remain in this position for five to ten minutes to facilitate the absorption of the drops. Then place a clean cotton ball in the ear for about an hour. The ear should be exposed to the air as much as possible. A cotton ball should be placed in the ear canal during the day while combing the hair, during exposure to a dusty environment, and at night to prevent drainage onto your pillow. At first, the drainage may be red-brown to brown in color, but the brown drainage usually becomes clear and disappears within a week or two. If drainage increases, call our office, [phone]. If your physician prescribes an antibiotic, fill the prescription promptly and take all of the medicine as directed until the entire supply is gone. If any of the following should occur, contact your physician: Persistent  bleeding Persistent fever Purulent drainage (pus) from the ear or incision Increasing redness around the suture line Persistent pain or dizziness Facial weakness Rash around the ear or incision Do not be overly concerned about your hearing until at least one month postoperatively. Your hearing may fluctuate as the ear heals. You may also experience some popping and cracking sounds in the ear for up to several weeks. It may sound like you are "talking in a barrel" or a tunnel. This is normal and should not cause concern. You may notice a metallic taste in your mouth for several weeks after ear surgery. The taste will usually go away spontaneously. Please ask your surgeon if any of the middle ear ossicles were replaced with metal parts. This may be important to know if you ever need to have a magnetic resonance imaging scan (MRI) in the future. It is important for you to return for your scheduled appointments.     Post Anesthesia Home Care Instructions  Activity: Get plenty of rest for the remainder of the day. A responsible individual must stay with you for 24 hours following the procedure.  For the next 24 hours, DO NOT: -Drive a car -Advertising copywriter -Drink alcoholic beverages -Take any medication unless instructed by your physician -Make any legal decisions or sign important papers.  Meals: Start with liquid foods such as gelatin or soup. Progress to regular foods as tolerated. Avoid greasy, spicy, heavy foods. If nausea and/or vomiting occur, drink only clear liquids until the nausea and/or vomiting subsides.  Call your physician if vomiting continues.  Special Instructions/Symptoms: Your throat may feel dry or sore from the anesthesia or the breathing tube placed in your throat during surgery. If this causes discomfort, gargle with warm salt water . The discomfort should disappear within 24 hours.  If you had a scopolamine patch placed behind your ear for the management of post-  operative nausea and/or vomiting:  1. The medication in the patch is effective for 72 hours, after which it should be removed.  Wrap patch in a tissue and discard in the trash. Wash hands thoroughly with soap and water . 2. You may remove the patch earlier than 72 hours if you experience unpleasant side effects which may include dry mouth, dizziness or visual disturbances. 3. Avoid touching the patch. Wash your hands with soap and water  after contact with the patch.

## 2023-07-29 NOTE — Anesthesia Procedure Notes (Signed)
 Procedure Name: LMA Insertion Date/Time: 07/29/2023 8:39 AM  Performed by: Sol Duos, CRNAPre-anesthesia Checklist: Patient identified, Emergency Drugs available, Suction available and Patient being monitored Patient Re-evaluated:Patient Re-evaluated prior to induction Oxygen Delivery Method: Circle system utilized Preoxygenation: Pre-oxygenation with 100% oxygen Induction Type: IV induction Ventilation: Mask ventilation without difficulty LMA: LMA inserted LMA Size: 4.0 Number of attempts: 1 Placement Confirmation: positive ETCO2 and breath sounds checked- equal and bilateral Tube secured with: Tape Dental Injury: Teeth and Oropharynx as per pre-operative assessment

## 2023-07-29 NOTE — H&P (Signed)
 CC: Left ear hearing loss, left tympanic membrane perforation   HPI:  Mackenzie Craig is a 73 y.o. female who presents today complaining of decreased left ear hearing for the past 2 months.  She was evaluated at [facility] at [facility] .  She was noted to have left ear conductive hearing loss.  She was also diagnosed with a left tympanic membrane perforation.  The patient has a history of recurrent childhood otitis media.  However, she has not noted any recent infection.  She underwent right ear tube placement as an adult to treat her right middle ear effusion.  The tube has since extruded.  She denies any left ear ventilating tube placement.  Currently she denies any otalgia, otorrhea, or vertigo.  She has no other ENT surgery.       Past Medical History:  Diagnosis Date   Asthma     GERD (gastroesophageal reflux disease)     Seasonal allergic rhinitis due to pollen                 Past Surgical History:  Procedure Laterality Date   BREAST EXCISIONAL BIOPSY Bilateral      benign   COLONOSCOPY N/A 01/11/2016    Procedure: COLONOSCOPY;  Surgeon: Ruby Corporal, MD;  [facility];  Service: Endoscopy;  Laterality: N/A;  12:00   COLONOSCOPY WITH PROPOFOL  N/A 04/12/2023    Procedure: COLONOSCOPY WITH PROPOFOL ;  Surgeon: Hargis Lias, MD;  Location[facility];  Service: Endoscopy;  Laterality: N/A;  1030AM, ASA 2   SLT LASER APPLICATION Left 03/12/2016    Procedure: SLT LASER APPLICATION;  Surgeon: Clay Cummins, MD;  Location: [facility];  Service: Ophthalmology;  Laterality: Left;               Family History  Problem Relation Age of Onset   Rheum arthritis Mother     Diabetes Mellitus II Father     Transient ischemic attack Father     Stroke Sister            Social History:  reports that she has never smoked. She has never used smokeless tobacco. She reports that she does not drink alcohol  and does not use drugs.   Allergies:  Allergies  No Known  Allergies            Prior to Admission medications   Medication Sig Start Date End Date Taking? Authorizing Provider  Biotin 1000 MCG tablet Take 1,000 mcg by mouth 3 (three) times daily. Pt not taking     Yes [provider]  budesonide-formoterol (SYMBICORT) 160-4.5 MCG/ACT inhaler Inhale 2 puffs into the lungs 2 (two) times daily.     Yes [provider]  Calcium Citrate (CITRACAL PO) Take by mouth.     Yes [provider]  Cranberry 500 MG CAPS Take by mouth.     Yes [provider]  dorzolamide (TRUSOPT) 2 % ophthalmic solution Place 1 drop into both eyes daily.     Yes [provider]  latanoprost (XALATAN) 0.005 % ophthalmic solution Place 1 drop into both eyes at bedtime.     Yes [provider]  Multiple Vitamin (MULTIVITAMIN WITH MINERALS) TABS tablet Take 1 tablet by mouth daily.     Yes [provider]  omeprazole (PRILOSEC) 20 MG capsule Take 20 mg by mouth daily.     Yes [provider]  tretinoin (RETIN-A) 0.1 % cream Apply 1 application topically at  bedtime as needed (ACNE).  10/24/15   Yes [provider]  acetaminophen  (TYLENOL ) 500 MG tablet Take 500 mg by mouth every 6 (six) hours as needed. Patient not taking: Reported on 07/15/2023       [provider]  albuterol  (VENTOLIN  HFA) 108 (90 Base) MCG/ACT inhaler Inhale into the lungs every 6 (six) hours as needed for wheezing or shortness of breath. Patient not taking: Reported on 07/15/2023       [provider]  cetirizine (ZYRTEC) 10 MG tablet Take 10 mg by mouth daily as needed. Patient not taking: Reported on 07/15/2023       [provider]  D-Mannose 500 MG CAPS Take by mouth. Patient not taking: Reported on 07/15/2023       [provider]  diazepam (VALIUM) 2 MG tablet Take 2 mg by mouth every 6 (six) hours as needed for anxiety. Patient not taking: Reported on 07/15/2023       [provider]   fexofenadine (ALLEGRA) 180 MG tablet Take 180 mg by mouth daily as needed. Patient not taking: Reported on 07/15/2023       [provider]  ibuprofen (ADVIL) 200 MG tablet Take 200 mg by mouth every 6 (six) hours as needed. Patient not taking: Reported on 07/15/2023       [provider]  loratadine (CLARITIN) 10 MG tablet Take 10 mg by mouth daily as needed. Patient not taking: Reported on 07/15/2023       [provider]  Na Sulfate-K Sulfate-Mg Sulf 17.5-3.13-1.6 GM/177ML SOLN As directed 03/14/23     Ahmed, Forbes Ida, MD      Blood pressure (!) 164/80, pulse 75, height 5\' 6"  (1.676 m), weight 160 lb (72.6 kg), SpO2 93%. Exam: General: Communicates without difficulty, well nourished, no acute distress. Head: Normocephalic, no evidence injury, no tenderness, facial buttresses intact without stepoff. Face/sinus: No tenderness to palpation and percussion. Facial movement is normal and symmetric. Eyes: PERRL, EOMI. No scleral icterus, conjunctivae clear. Neuro: CN II exam reveals vision grossly intact.  No nystagmus at any point of gaze. Ears: Auricles well formed without lesions.  Left ear cerumen impaction.  The right ear canal and tympanic membrane are normal.  Nose: External evaluation reveals normal support and skin without lesions.  Dorsum is intact.  Anterior rhinoscopy reveals congested mucosa over anterior aspect of inferior turbinates and intact septum.  No purulence noted. Oral:  Oral cavity and oropharynx are intact, symmetric, without erythema or edema.  Mucosa is moist without lesions. Neck: Full range of motion without pain.  There is no significant lymphadenopathy.  No masses palpable.  Thyroid  bed within normal limits to palpation.  Parotid glands and submandibular glands equal bilaterally without mass.  Trachea is midline. Neuro:  CN 2-12 grossly intact.    Procedure: Left ear cerumen disimpaction Anesthesia: None Description: Under the operating microscope,  the cerumen is carefully removed with a combination of cerumen currette, alligator forceps, and suction catheters.  After the cerumen is removed, a 50% left inferior tympanic membrane perforation is noted.  No mass, erythema, or lesions. The patient tolerated the procedure well.     Assessment: 1.  Left ear cerumen impaction.  After the cerumen removal procedure, a 50% left inferior tympanic membrane perforation is noted. 2.  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  Her hearing test was performed in [place], [place] . 3.  No acute infection is noted today.   Plan:  1.  Otomicroscopy with left ear cerumen disimpaction. 2.  The physical exam findings are reviewed with the patient. 3.  Dry ear precautions on the left side. 4.  The treatment options are extensively discussed.  The options include conservative observation, use of left ear hearing aid, or left tympanoplasty surgery to close the perforation.  The risk, benefits, alternatives, and details of the treatment options are extensively discussed.  Questions are invited and answered. 5.  The patient is interested in proceeding with a left tympanoplasty procedure.  The repair will be performed with a temporalis fascia graft.

## 2023-07-29 NOTE — Anesthesia Preprocedure Evaluation (Signed)
 Anesthesia Evaluation  Patient identified by MRN, date of birth, ID band Patient awake    Reviewed: Allergy & Precautions, NPO status , Patient's Chart, lab work & pertinent test results  History of Anesthesia Complications Negative for: history of anesthetic complications  Airway Mallampati: I  TM Distance: >3 FB Neck ROM: Full    Dental  (+) Dental Advisory Given, Caps,    Pulmonary neg shortness of breath, asthma , neg sleep apnea, neg recent URI   breath sounds clear to auscultation       Cardiovascular negative cardio ROS  Rhythm:Regular     Neuro/Psych negative neurological ROS  negative psych ROS   GI/Hepatic Neg liver ROS,GERD  Medicated and Controlled,,  Endo/Other  negative endocrine ROS    Renal/GU negative Renal ROS     Musculoskeletal negative musculoskeletal ROS (+)    Abdominal   Peds  Hematology negative hematology ROS (+)   Anesthesia Other Findings   Reproductive/Obstetrics                             Anesthesia Physical Anesthesia Plan  ASA: 2  Anesthesia Plan: General   Post-op Pain Management:    Induction: Intravenous  PONV Risk Score and Plan: 3 and Ondansetron  and Dexamethasone   Airway Management Planned: LMA  Additional Equipment: None  Intra-op Plan:   Post-operative Plan: Extubation in OR  Informed Consent: I have reviewed the patients History and Physical, chart, labs and discussed the procedure including the risks, benefits and alternatives for the proposed anesthesia with the patient or authorized representative who has indicated his/her understanding and acceptance.     Dental advisory given  Plan Discussed with: CRNA  Anesthesia Plan Comments:        Anesthesia Quick Evaluation

## 2023-07-29 NOTE — Op Note (Signed)
 DATE OF PROCEDURE: 07/29/2023  OPERATIVE REPORT    SURGEON:  Reynold Caves, MD   PREOPERATIVE DIAGNOSIS:  1.  Left tympanic membrane perforation. 2.  Left ear conductive hearing loss   POSTOPERATIVE DIAGNOSIS:   1.  Left tympanic membrane perforation. 2.  Left ear conductive hearing loss   PROCEDURES PERFORMED: 1.  Left transcanal tympanoplasty. 2.  Left postauricular temporalis fascia graft harvesting.   ANESTHESIA:  General laryngeal mask anesthesia.   COMPLICATIONS:  None.   ESTIMATED BLOOD LOSS:  Minimal.   INDICATION FOR PROCEDURE:   Mackenzie Craig is a 73 y.o. female who has a history of left tympanic membrane perforation and left ear conductive hearing loss.  At the last visit, a nearly 50% TM perforation was noted.  Based on the findings, the decision was made for the patient to undergo the above-stated procedures.  The risks, benefits, alternatives, and details of the procedures were discussed with the mother.  Questions were invited and answered.  Informed consent was obtained.   DESCRIPTION OF PROCEDURE:  The patient was taken to the operating room and placed supine on the operating table.  General laryngeal mask anesthesia was induced by the anesthesiologist.  Under the operating microscope, the left ear canal was cleaned of all cerumen.  A rim of fibrotic tissue was removed circumferentially from the perforation.  A standard tympanomeatal flap was elevated in a standard fashion.  No other pathology was noted.   Attention was then focused on obtaining the temporalis fascia graft.  A separate postauricular incision was made.  Incision was carried down to the level of the temporalis fascia.  A 2 x 2 cm temporalis fascia graft was harvested in a standard fashion.  Hemostasis was achieved with Bovie electrocautery.  The surgical site was copiously irrigated.  The incision was closed in layers with 4-0 Vicryl and Dermabond.   Under the operating microscope, the harvested graft was  inserted via the ear canal into the middle ear space.  It was used to cover the TM perforation.  Gelfoam was used to pack the middle ear and ear canal, sandwiching the neotympanum.  Ciprodex  ear drops were applied. Antibiotic ointment was applied to the ear canal.  That concluded the procedure for the patient.  The care of the patient was turned over to the anesthesiologist.  The patient was awakened from anesthesia without difficulty.  She was extubated and transferred to the recovery room in good condition.   OPERATIVE FINDINGS: A 50% left inferior TM perforation was noted.   SPECIMEN:  None.   FOLLOWUP CARE:  The patient will be discharged home once she is awake and alert.  She will follow up in my office in 1 week.

## 2023-07-29 NOTE — Transfer of Care (Signed)
 Immediate Anesthesia Transfer of Care Note  Patient: Rowland Copas  Procedure(s) Performed: TYMPANOPLASTY WITH GRAFT (Left: Ear)  Patient Location: PACU  Anesthesia Type:General  Level of Consciousness: awake  Airway & Oxygen Therapy: Patient Spontanous Breathing and Patient connected to nasal cannula oxygen  Post-op Assessment: Report given to RN and Post -op Vital signs reviewed and stable  Post vital signs: Reviewed and stable  Last Vitals:  Vitals Value Taken Time  BP 150/81 07/29/23 0953  Temp 36.3 C 07/29/23 0953  Pulse 100 07/29/23 0954  Resp 14 07/29/23 0954  SpO2 96 % 07/29/23 0954  Vitals shown include unfiled device data.  Last Pain:  Vitals:   07/29/23 0727  TempSrc: Temporal  PainSc: 0-No pain      Patients Stated Pain Goal: 4 (07/29/23 0727)  Complications: No notable events documented.

## 2023-07-30 ENCOUNTER — Encounter (HOSPITAL_BASED_OUTPATIENT_CLINIC_OR_DEPARTMENT_OTHER): Payer: Self-pay | Admitting: Otolaryngology

## 2023-07-31 NOTE — Anesthesia Postprocedure Evaluation (Addendum)
Anesthesia Post Note  Patient: Mackenzie Craig  Procedure(s) Performed: TYMPANOPLASTY WITH GRAFT (Left: Ear)     Patient location during evaluation: PACU Anesthesia Type: General Level of consciousness: awake and alert Pain management: pain level controlled Vital Signs Assessment: post-procedure vital signs reviewed and stable Respiratory status: spontaneous breathing, nonlabored ventilation and respiratory function stable Cardiovascular status: blood pressure returned to baseline and stable Postop Assessment: no apparent nausea or vomiting Anesthetic complications: no   No notable events documented.  Last Vitals:  Vitals:   07/29/23 1015 07/29/23 1030  BP: (!) 146/80 133/74  Pulse: 89 90  Resp: 13 16  Temp:  (!) 36.2 C  SpO2: 93% 95%    Last Pain:  Vitals:   07/30/23 0955  TempSrc:   PainSc: 0-No pain                 [name]

## 2023-08-05 ENCOUNTER — Ambulatory Visit (INDEPENDENT_AMBULATORY_CARE_PROVIDER_SITE_OTHER): Payer: Medicare HMO | Admitting: Otolaryngology

## 2023-08-05 ENCOUNTER — Encounter (INDEPENDENT_AMBULATORY_CARE_PROVIDER_SITE_OTHER): Payer: Self-pay

## 2023-08-05 VITALS — BP 171/83 | HR 70 | Ht 66.0 in | Wt 160.0 lb

## 2023-08-05 DIAGNOSIS — H7202 Central perforation of tympanic membrane, left ear: Secondary | ICD-10-CM

## 2023-08-05 NOTE — Progress Notes (Signed)
Patient ID: Mackenzie Craig, female   DOB: 1951/05/08, 73 y.o.   MRN: [id number]  The patient is 1 week status post left tympanoplasty.  No postop issues.  Left ear canal is debrided without difficulty.  The neotympanum is intact.  Continue dry ear precautions on the left side.  Recheck in 6 months.

## 2023-09-06 DIAGNOSIS — H401132 Primary open-angle glaucoma, bilateral, moderate stage: Secondary | ICD-10-CM | POA: Diagnosis not present

## 2023-09-06 DIAGNOSIS — H43813 Vitreous degeneration, bilateral: Secondary | ICD-10-CM | POA: Diagnosis not present

## 2023-09-06 DIAGNOSIS — H2513 Age-related nuclear cataract, bilateral: Secondary | ICD-10-CM | POA: Diagnosis not present

## 2023-09-12 ENCOUNTER — Other Ambulatory Visit: Payer: Self-pay | Admitting: Internal Medicine

## 2023-09-12 DIAGNOSIS — Z1231 Encounter for screening mammogram for malignant neoplasm of breast: Secondary | ICD-10-CM

## 2023-10-07 DIAGNOSIS — E663 Overweight: Secondary | ICD-10-CM | POA: Diagnosis not present

## 2023-10-07 DIAGNOSIS — R059 Cough, unspecified: Secondary | ICD-10-CM | POA: Diagnosis not present

## 2023-10-07 DIAGNOSIS — Z6829 Body mass index (BMI) 29.0-29.9, adult: Secondary | ICD-10-CM | POA: Diagnosis not present

## 2023-10-07 DIAGNOSIS — R030 Elevated blood-pressure reading, without diagnosis of hypertension: Secondary | ICD-10-CM | POA: Diagnosis not present

## 2023-10-22 DIAGNOSIS — Z299 Encounter for prophylactic measures, unspecified: Secondary | ICD-10-CM | POA: Diagnosis not present

## 2023-10-22 DIAGNOSIS — E7800 Pure hypercholesterolemia, unspecified: Secondary | ICD-10-CM | POA: Diagnosis not present

## 2023-10-22 DIAGNOSIS — Z1339 Encounter for screening examination for other mental health and behavioral disorders: Secondary | ICD-10-CM | POA: Diagnosis not present

## 2023-10-22 DIAGNOSIS — M8580 Other specified disorders of bone density and structure, unspecified site: Secondary | ICD-10-CM | POA: Diagnosis not present

## 2023-10-22 DIAGNOSIS — Z79899 Other long term (current) drug therapy: Secondary | ICD-10-CM | POA: Diagnosis not present

## 2023-10-22 DIAGNOSIS — Z1331 Encounter for screening for depression: Secondary | ICD-10-CM | POA: Diagnosis not present

## 2023-10-22 DIAGNOSIS — Z0000 Encounter for general adult medical examination without abnormal findings: Secondary | ICD-10-CM | POA: Diagnosis not present

## 2023-10-22 DIAGNOSIS — Z7189 Other specified counseling: Secondary | ICD-10-CM | POA: Diagnosis not present

## 2023-10-22 DIAGNOSIS — R5383 Other fatigue: Secondary | ICD-10-CM | POA: Diagnosis not present

## 2023-10-24 DIAGNOSIS — L728 Other follicular cysts of the skin and subcutaneous tissue: Secondary | ICD-10-CM | POA: Diagnosis not present

## 2023-10-24 DIAGNOSIS — L718 Other rosacea: Secondary | ICD-10-CM | POA: Diagnosis not present

## 2023-10-24 DIAGNOSIS — L908 Other atrophic disorders of skin: Secondary | ICD-10-CM | POA: Diagnosis not present

## 2023-10-25 ENCOUNTER — Ambulatory Visit
Admission: RE | Admit: 2023-10-25 | Discharge: 2023-10-25 | Disposition: A | Discharge status: Home / Self Care | Source: Ambulatory Visit | Attending: Internal Medicine | Admitting: Internal Medicine

## 2023-10-25 DIAGNOSIS — Z1231 Encounter for screening mammogram for malignant neoplasm of breast: Secondary | ICD-10-CM

## 2023-11-01 DIAGNOSIS — R0609 Other forms of dyspnea: Secondary | ICD-10-CM | POA: Diagnosis not present

## 2023-11-01 DIAGNOSIS — J4540 Moderate persistent asthma, uncomplicated: Secondary | ICD-10-CM | POA: Diagnosis not present

## 2023-11-04 DIAGNOSIS — Z13820 Encounter for screening for osteoporosis: Secondary | ICD-10-CM | POA: Diagnosis not present

## 2023-11-04 DIAGNOSIS — E2839 Other primary ovarian failure: Secondary | ICD-10-CM | POA: Diagnosis not present

## 2024-01-09 DIAGNOSIS — R0981 Nasal congestion: Secondary | ICD-10-CM | POA: Diagnosis not present

## 2024-01-09 DIAGNOSIS — R0602 Shortness of breath: Secondary | ICD-10-CM | POA: Diagnosis not present

## 2024-01-09 DIAGNOSIS — Z299 Encounter for prophylactic measures, unspecified: Secondary | ICD-10-CM | POA: Diagnosis not present

## 2024-01-09 DIAGNOSIS — J069 Acute upper respiratory infection, unspecified: Secondary | ICD-10-CM | POA: Diagnosis not present

## 2024-01-13 DIAGNOSIS — J4540 Moderate persistent asthma, uncomplicated: Secondary | ICD-10-CM | POA: Diagnosis not present

## 2024-01-13 DIAGNOSIS — J45909 Unspecified asthma, uncomplicated: Secondary | ICD-10-CM | POA: Diagnosis not present

## 2024-01-27 DIAGNOSIS — Z299 Encounter for prophylactic measures, unspecified: Secondary | ICD-10-CM | POA: Diagnosis not present

## 2024-01-27 DIAGNOSIS — E7800 Pure hypercholesterolemia, unspecified: Secondary | ICD-10-CM | POA: Diagnosis not present

## 2024-01-27 DIAGNOSIS — H6990 Unspecified Eustachian tube disorder, unspecified ear: Secondary | ICD-10-CM | POA: Diagnosis not present

## 2024-01-29 DIAGNOSIS — H401132 Primary open-angle glaucoma, bilateral, moderate stage: Secondary | ICD-10-CM | POA: Diagnosis not present

## 2024-01-29 DIAGNOSIS — H2513 Age-related nuclear cataract, bilateral: Secondary | ICD-10-CM | POA: Diagnosis not present

## 2024-02-07 ENCOUNTER — Ambulatory Visit (INDEPENDENT_AMBULATORY_CARE_PROVIDER_SITE_OTHER): Payer: Medicare HMO | Admitting: Otolaryngology

## 2024-02-07 ENCOUNTER — Encounter (INDEPENDENT_AMBULATORY_CARE_PROVIDER_SITE_OTHER): Payer: Self-pay | Admitting: Otolaryngology

## 2024-02-07 VITALS — BP 133/82 | HR 92

## 2024-02-07 DIAGNOSIS — H6122 Impacted cerumen, left ear: Secondary | ICD-10-CM | POA: Diagnosis not present

## 2024-02-07 DIAGNOSIS — H7202 Central perforation of tympanic membrane, left ear: Secondary | ICD-10-CM

## 2024-02-10 NOTE — Progress Notes (Signed)
 Patient ID: Mackenzie Craig, female   DOB: 1950-08-30, 73 y.o.   MRN: [id number]  Follow-up: Left tympanic membrane perforation  HPI: The patient is a 73 year old female who returns today for her follow-up evaluation.  The patient was previously seen for her left tympanic membrane perforation.  She underwent left tympanoplasty surgery in February 2025 to close the perforation.  The patient returns today reporting no difficulty since the procedure.  Her left ear hearing has improved.  Currently she denies any otalgia or otorrhea.  Exam: General: Communicates without difficulty, well nourished, no acute distress. Head: Normocephalic, no evidence injury, no tenderness, facial buttresses intact without stepoff. Face/sinus: No tenderness to palpation and percussion. Facial movement is normal and symmetric. Eyes: PERRL, EOMI. No scleral icterus, conjunctivae clear. Neuro: CN II exam reveals vision grossly intact.  No nystagmus at any point of gaze. Ears: Auricles well formed without lesions.  Left ear cerumen impaction.  The right tympanic membrane and middle ear space are normal.  Nose: External evaluation reveals normal support and skin without lesions.  Dorsum is intact.  Anterior rhinoscopy reveals congested mucosa over anterior aspect of inferior turbinates and intact septum.  No purulence noted. Oral:  Oral cavity and oropharynx are intact, symmetric, without erythema or edema.  Mucosa is moist without lesions. Neck: Full range of motion without pain.  There is no significant lymphadenopathy.  No masses palpable.  Thyroid  bed within normal limits to palpation.  Parotid glands and submandibular glands equal bilaterally without mass.  Trachea is midline. Neuro:  CN 2-12 grossly intact.   Procedure: Left ear cerumen disimpaction Anesthesia: None Description: Under the operating microscope, the cerumen is carefully removed with a combination of cerumen currette, alligator forceps, and suction catheters.  After  the cerumen is removed, the TMs are noted to be normal.  No mass, erythema, or lesions. The patient tolerated the procedure well.    Assessment: 1.  Left ear cerumen impaction.  After the cerumen disimpaction procedure, the left tympanic membrane is noted to be intact and mobile.  The left tympanic membrane perforation has healed. 2.  The right ear canal, tympanic membrane, and middle ear space are normal.  Plan: 1.  Otomicroscopy with left ear cerumen disimpaction. 2.  The physical exam findings are reviewed with the patient. 3.  The patient is released from her dry ear precautions. 4.  The patient is encouraged to call with any questions or concerns.

## 2024-02-25 DIAGNOSIS — Z299 Encounter for prophylactic measures, unspecified: Secondary | ICD-10-CM | POA: Diagnosis not present

## 2024-02-25 DIAGNOSIS — H6990 Unspecified Eustachian tube disorder, unspecified ear: Secondary | ICD-10-CM | POA: Diagnosis not present

## 2024-02-25 DIAGNOSIS — Z0000 Encounter for general adult medical examination without abnormal findings: Secondary | ICD-10-CM | POA: Diagnosis not present

## 2024-05-05 DIAGNOSIS — R0609 Other forms of dyspnea: Secondary | ICD-10-CM | POA: Diagnosis not present

## 2024-05-05 DIAGNOSIS — J4540 Moderate persistent asthma, uncomplicated: Secondary | ICD-10-CM | POA: Diagnosis not present

## 2024-05-05 DIAGNOSIS — J302 Other seasonal allergic rhinitis: Secondary | ICD-10-CM | POA: Diagnosis not present
